# Patient Record
Sex: Male | Born: 1985
Health system: Southern US, Community
[De-identification: ages and names within clinical notes are randomized; demographics above are authoritative.]

## PROBLEM LIST (undated history)

## (undated) DIAGNOSIS — K219 Gastro-esophageal reflux disease without esophagitis: Secondary | ICD-10-CM

## (undated) DIAGNOSIS — H6120 Impacted cerumen, unspecified ear: Secondary | ICD-10-CM

## (undated) DIAGNOSIS — N289 Disorder of kidney and ureter, unspecified: Secondary | ICD-10-CM

## (undated) DIAGNOSIS — M940 Chondrocostal junction syndrome [Tietze]: Secondary | ICD-10-CM

## (undated) DIAGNOSIS — R2242 Localized swelling, mass and lump, left lower limb: Secondary | ICD-10-CM

## (undated) DIAGNOSIS — R200 Anesthesia of skin: Secondary | ICD-10-CM

## (undated) DIAGNOSIS — I10 Essential (primary) hypertension: Secondary | ICD-10-CM

## (undated) DIAGNOSIS — R079 Chest pain, unspecified: Secondary | ICD-10-CM

## (undated) DIAGNOSIS — R300 Dysuria: Secondary | ICD-10-CM

## (undated) DIAGNOSIS — G379 Demyelinating disease of central nervous system, unspecified: Secondary | ICD-10-CM

## (undated) DIAGNOSIS — R42 Dizziness and giddiness: Secondary | ICD-10-CM

## (undated) HISTORY — DX: Chest pain, unspecified: R07.9

## (undated) HISTORY — DX: Demyelinating disease of central nervous system, unspecified: G37.9

## (undated) HISTORY — DX: Anesthesia of skin: R20.0

## (undated) HISTORY — DX: Gastro-esophageal reflux disease without esophagitis: K21.9

## (undated) HISTORY — DX: Dysuria: R30.0

## (undated) HISTORY — DX: Impacted cerumen, unspecified ear: H61.20

## (undated) HISTORY — DX: Localized swelling, mass and lump, left lower limb: R22.42

## (undated) HISTORY — DX: Chondrocostal junction syndrome (tietze): M94.0

## (undated) HISTORY — DX: Dizziness and giddiness: R42

---

## 2012-07-18 DIAGNOSIS — E669 Obesity, unspecified: Secondary | ICD-10-CM | POA: Insufficient documentation

## 2015-04-04 ENCOUNTER — Emergency Department (HOSPITAL_COMMUNITY): Payer: BLUE CROSS/BLUE SHIELD

## 2015-04-04 ENCOUNTER — Emergency Department (HOSPITAL_COMMUNITY)
Admission: EM | Admit: 2015-04-04 | Discharge: 2015-04-04 | Disposition: A | Discharge status: Home / Self Care | Payer: BLUE CROSS/BLUE SHIELD | Attending: Emergency Medicine | Admitting: Emergency Medicine

## 2015-04-04 ENCOUNTER — Encounter (HOSPITAL_COMMUNITY): Payer: Self-pay | Admitting: *Deleted

## 2015-04-04 DIAGNOSIS — N201 Calculus of ureter: Secondary | ICD-10-CM | POA: Insufficient documentation

## 2015-04-04 DIAGNOSIS — R109 Unspecified abdominal pain: Secondary | ICD-10-CM | POA: Diagnosis present

## 2015-04-04 DIAGNOSIS — Z79899 Other long term (current) drug therapy: Secondary | ICD-10-CM | POA: Insufficient documentation

## 2015-04-04 HISTORY — DX: Disorder of kidney and ureter, unspecified: N28.9

## 2015-04-04 HISTORY — DX: Essential (primary) hypertension: I10

## 2015-04-04 LAB — BASIC METABOLIC PANEL
ANION GAP: 8 (ref 5–15)
BUN: 16 mg/dL (ref 6–20)
CALCIUM: 9.4 mg/dL (ref 8.9–10.3)
CO2: 26 mmol/L (ref 22–32)
Chloride: 106 mmol/L (ref 101–111)
Creatinine, Ser: 1.37 mg/dL — ABNORMAL HIGH (ref 0.61–1.24)
Glucose, Bld: 128 mg/dL — ABNORMAL HIGH (ref 65–99)
Potassium: 4 mmol/L (ref 3.5–5.1)
Sodium: 140 mmol/L (ref 135–145)

## 2015-04-04 LAB — CBC WITH DIFFERENTIAL/PLATELET
BASOS ABS: 0.1 10*3/uL (ref 0.0–0.1)
BASOS PCT: 0 %
Eosinophils Absolute: 0 10*3/uL (ref 0.0–0.7)
Eosinophils Relative: 0 %
HEMATOCRIT: 41.1 % (ref 39.0–52.0)
HEMOGLOBIN: 13.8 g/dL (ref 13.0–17.0)
Lymphocytes Relative: 7 %
Lymphs Abs: 1.2 10*3/uL (ref 0.7–4.0)
MCH: 28.2 pg (ref 26.0–34.0)
MCHC: 33.6 g/dL (ref 30.0–36.0)
MCV: 83.9 fL (ref 78.0–100.0)
Monocytes Absolute: 0.9 10*3/uL (ref 0.1–1.0)
Monocytes Relative: 5 %
NEUTROS ABS: 15.3 10*3/uL — AB (ref 1.7–7.7)
NEUTROS PCT: 88 %
Platelets: 333 10*3/uL (ref 150–400)
RBC: 4.9 MIL/uL (ref 4.22–5.81)
RDW: 13.2 % (ref 11.5–15.5)
WBC: 17.4 10*3/uL — AB (ref 4.0–10.5)

## 2015-04-04 LAB — URINE MICROSCOPIC-ADD ON

## 2015-04-04 LAB — URINALYSIS, ROUTINE W REFLEX MICROSCOPIC
Bilirubin Urine: NEGATIVE
Glucose, UA: NEGATIVE mg/dL
KETONES UR: NEGATIVE mg/dL
Leukocytes, UA: NEGATIVE
Nitrite: NEGATIVE
PH: 5.5 (ref 5.0–8.0)
Specific Gravity, Urine: >1.03 — ABNORMAL HIGH (ref 1.005–1.030)

## 2015-04-04 MED ORDER — ONDANSETRON 8 MG PO TBDP: 8.0000 mg | ORAL_TABLET | Freq: Three times a day (TID) | ORAL | Status: DC | PRN

## 2015-04-04 MED ORDER — KETOROLAC TROMETHAMINE 30 MG/ML IJ SOLN
30.0000 mg | Freq: Once | INTRAMUSCULAR | Status: AC
  Administered 2015-04-04: 30 mg via INTRAVENOUS
  Filled 2015-04-04: qty 1

## 2015-04-04 MED ORDER — OXYCODONE-ACETAMINOPHEN 5-325 MG PO TABS: 1.0000 | ORAL_TABLET | Freq: Four times a day (QID) | ORAL | Status: DC | PRN

## 2015-04-04 MED ORDER — ONDANSETRON HCL 4 MG/2ML IJ SOLN
4.0000 mg | Freq: Once | INTRAMUSCULAR | Status: AC
  Administered 2015-04-04: 4 mg via INTRAVENOUS
  Filled 2015-04-04: qty 2

## 2015-04-04 MED ORDER — HYDROMORPHONE HCL 1 MG/ML IJ SOLN
1.0000 mg | Freq: Once | INTRAMUSCULAR | Status: AC
  Administered 2015-04-04: 1 mg via INTRAVENOUS
  Filled 2015-04-04: qty 1

## 2015-04-04 NOTE — Discharge Instructions (Signed)
Kidney Stones °Kidney stones (urolithiasis) are deposits that form inside your kidneys. The intense pain is caused by the stone moving through the urinary tract. When the stone moves, the ureter goes into spasm around the stone. The stone is usually passed in the urine.  °CAUSES  °· A disorder that makes certain neck glands produce too much parathyroid hormone (primary hyperparathyroidism). °· A buildup of uric acid crystals, similar to gout in your joints. °· Narrowing (stricture) of the ureter. °· A kidney obstruction present at birth (congenital obstruction). °· Previous surgery on the kidney or ureters. °· Numerous kidney infections. °SYMPTOMS  °· Feeling sick to your stomach (nauseous). °· Throwing up (vomiting). °· Blood in the urine (hematuria). °· Pain that usually spreads (radiates) to the groin. °· Frequency or urgency of urination. °DIAGNOSIS  °· Taking a history and physical exam. °· Blood or urine tests. °· CT scan. °· Occasionally, an examination of the inside of the urinary bladder (cystoscopy) is performed. °TREATMENT  °· Observation. °· Increasing your fluid intake. °· Extracorporeal shock wave lithotripsy--This is a noninvasive procedure that uses shock waves to break up kidney stones. °· Surgery may be needed if you have severe pain or persistent obstruction. There are various surgical procedures. Most of the procedures are performed with the use of small instruments. Only small incisions are needed to accommodate these instruments, so recovery time is minimized. °The size, location, and chemical composition are all important variables that will determine the proper choice of action for you. Talk to your health care provider to better understand your situation so that you will minimize the risk of injury to yourself and your kidney.  °HOME CARE INSTRUCTIONS  °· Drink enough water and fluids to keep your urine clear or pale yellow. This will help you to pass the stone or stone fragments. °· Strain  all urine through the provided strainer. Keep all particulate matter and stones for your health care provider to see. The stone causing the pain may be as small as a grain of salt. It is very important to use the strainer each and every time you pass your urine. The collection of your stone will allow your health care provider to analyze it and verify that a stone has actually passed. The stone analysis will often identify what you can do to reduce the incidence of recurrences. °· Only take over-the-counter or prescription medicines for pain, discomfort, or fever as directed by your health care provider. °· Keep all follow-up visits as told by your health care provider. This is important. °· Get follow-up X-rays if required. The absence of pain does not always mean that the stone has passed. It may have only stopped moving. If the urine remains completely obstructed, it can cause loss of kidney function or even complete destruction of the kidney. It is your responsibility to make sure X-rays and follow-ups are completed. Ultrasounds of the kidney can show blockages and the status of the kidney. Ultrasounds are not associated with any radiation and can be performed easily in a matter of minutes. °· Make changes to your daily diet as told by your health care provider. You may be told to: °¨ Limit the amount of salt that you eat. °¨ Eat 5 or more servings of fruits and vegetables each day. °¨ Limit the amount of meat, poultry, fish, and eggs that you eat. °· Collect a 24-hour urine sample as told by your health care provider. You may need to collect another urine sample every 6-12   months. °SEEK MEDICAL CARE IF: °· You experience pain that is progressive and unresponsive to any pain medicine you have been prescribed. °SEEK IMMEDIATE MEDICAL CARE IF:  °· Pain cannot be controlled with the prescribed medicine. °· You have a fever or shaking chills. °· The severity or intensity of pain increases over 18 hours and is not  relieved by pain medicine. °· You develop a new onset of abdominal pain. °· You feel faint or pass out. °· You are unable to urinate. °  °This information is not intended to replace advice given to you by your health care provider. Make sure you discuss any questions you have with your health care provider. °  °Document Released: 03/15/2005 Document Revised: 12/04/2014 Document Reviewed: 08/16/2012 °Elsevier Interactive Patient Education ©2016 Elsevier Inc. ° °

## 2015-04-04 NOTE — ED Provider Notes (Signed)
CSN: [id number]     Arrival date & time 04/04/15  2021 History   First MD Initiated Contact with Patient 04/04/15 2024     Chief Complaint  Patient presents with  . Flank Pain     Patient is a 30 y.o. male presenting with flank pain. The history is provided by the patient.  Flank Pain This is a recurrent problem. Pertinent negatives include no chest pain, no abdominal pain, no headaches and no shortness of breath.   patient presents with left flank pain. Has had previous kidney stones. States this feels the same. Has had nausea. Said pain for last couple days. He cannot find a comfortable position. No fevers or chills. No cough. States he has some urinary hesitancy.  Past Medical History  Diagnosis Date  . Renal disorder     kidney stones  . Hypertension     prehypertension   History reviewed. No pertinent past surgical history. History reviewed. No pertinent family history. Social History  Substance Use Topics  . Smoking status: Never Smoker   . Smokeless tobacco: None  . Alcohol Use: No    Review of Systems  Constitutional: Negative for activity change and appetite change.  Eyes: Negative for pain.  Respiratory: Negative for chest tightness and shortness of breath.   Cardiovascular: Negative for chest pain and leg swelling.  Gastrointestinal: Negative for nausea, vomiting, abdominal pain and diarrhea.  Genitourinary: Positive for flank pain.  Musculoskeletal: Negative for back pain and neck stiffness.  Skin: Negative for rash.  Neurological: Negative for weakness, numbness and headaches.  Psychiatric/Behavioral: Negative for behavioral problems.      Allergies  Review of patient's allergies indicates no known allergies.  Home Medications   Prior to Admission medications   Medication Sig Start Date End Date Taking? Authorizing Provider  acetaminophen (TYLENOL) 500 MG tablet Take 1,000 mg by mouth every 6 (six) hours as needed for mild pain, moderate pain or  headache.   Yes Historical Provider, MD  lisinopril (PRINIVIL,ZESTRIL) 5 MG tablet Take 5 mg by mouth every evening.    Yes Historical Provider, MD   BP 144/95 mmHg  Pulse 72  Temp(Src) 98.1 F (36.7 C) (Oral)  Resp 20  Ht 6\' 6"  (1.981 m)  Wt 340 lb (154.223 kg)  BMI 39.30 kg/m2  SpO2 97% Physical Exam  Constitutional: He appears well-developed.  Patient appears uncomfortable  HENT:  Head: Atraumatic.  Neck: Neck supple.  Cardiovascular: Normal rate.   Pulmonary/Chest: Effort normal.  Abdominal: Soft.  Mild left lower quadrant tenderness without rebound or guarding. Some CVA tenderness on left also.  Musculoskeletal: Normal range of motion.  Neurological: He is alert.    ED Course  Procedures (including critical care time) Labs Review Labs Reviewed  URINALYSIS, ROUTINE W REFLEX MICROSCOPIC (NOT AT [facility]) - Abnormal; Notable for the following:    Specific Gravity, Urine >1.030 (*)    Hgb urine dipstick SMALL (*)    Protein, ur TRACE (*)    All other components within normal limits  BASIC METABOLIC PANEL - Abnormal; Notable for the following:    Glucose, Bld 128 (*)    Creatinine, Ser 1.37 (*)    All other components within normal limits  CBC WITH DIFFERENTIAL/PLATELET - Abnormal; Notable for the following:    WBC 17.4 (*)    Neutro Abs 15.3 (*)    All other components within normal limits  URINE MICROSCOPIC-ADD ON - Abnormal; Notable for the following:    Squamous Epithelial /  LPF 0-5 (*)    Bacteria, UA RARE (*)    All other components within normal limits  URINE CULTURE    Imaging Review Dg Abd 1 View  04/04/2015  CLINICAL DATA:  Left sided flank pain radiating to the low abdomen with nausea and vomiting. Symptoms for 5 hours. EXAM: ABDOMEN - 1 VIEW COMPARISON:  None. FINDINGS: Small stone, 1-2 mm, projects in the left lower pelvis which could reflect distal left ureteral stone or a phlebolith. No other evidence of a ureteral stone. No evidence of a renal stone.  Soft tissues are otherwise unremarkable. Normal bowel gas pattern. Skeletal structures are unremarkable. IMPRESSION: Possible small stone in the distal left ureter. Otherwise unremarkable. Electronically Signed   By: Amie Portland M.D.   On: 04/04/2015 21:20   I have personally reviewed and evaluated these images and lab results as part of my medical decision-making.   EKG Interpretation None      MDM   Final diagnoses:  None    Patient with flank pain. Likely ureteral stone. Has had same. Likely stone on KUB. Feels much better after treatment. Creatinine is mildly elevated. Will need to be followed. White count is also elevated but doubt infection. Patient feels better will be discharged home.    Benjiman Core, MD 04/04/15 2233

## 2015-04-04 NOTE — ED Notes (Signed)
Pt c/o left sided flank pain that radiates around to his lower abdomen. Pt also has n/v.

## 2015-04-07 LAB — URINE CULTURE: CULTURE: NO GROWTH

## 2016-09-28 ENCOUNTER — Ambulatory Visit: Payer: Self-pay | Admitting: Emergency Medicine

## 2016-10-25 ENCOUNTER — Encounter: Payer: Self-pay | Admitting: Family Medicine

## 2016-10-25 ENCOUNTER — Ambulatory Visit (INDEPENDENT_AMBULATORY_CARE_PROVIDER_SITE_OTHER): Payer: BLUE CROSS/BLUE SHIELD | Admitting: Family Medicine

## 2016-10-25 VITALS — BP 122/74 | HR 59 | Temp 98.8°F | Ht 78.0 in | Wt 358.2 lb

## 2016-10-25 DIAGNOSIS — R2242 Localized swelling, mass and lump, left lower limb: Secondary | ICD-10-CM | POA: Diagnosis not present

## 2016-10-25 DIAGNOSIS — R200 Anesthesia of skin: Secondary | ICD-10-CM

## 2016-10-25 DIAGNOSIS — G379 Demyelinating disease of central nervous system, unspecified: Secondary | ICD-10-CM | POA: Diagnosis not present

## 2016-10-25 DIAGNOSIS — I10 Essential (primary) hypertension: Secondary | ICD-10-CM | POA: Diagnosis not present

## 2016-10-25 DIAGNOSIS — H6123 Impacted cerumen, bilateral: Secondary | ICD-10-CM | POA: Diagnosis not present

## 2016-10-25 DIAGNOSIS — M940 Chondrocostal junction syndrome [Tietze]: Secondary | ICD-10-CM

## 2016-10-25 LAB — POCT URINALYSIS DIP (MANUAL ENTRY)
Bilirubin, UA: NEGATIVE
Blood, UA: NEGATIVE
GLUCOSE UA: NEGATIVE mg/dL
Ketones, POC UA: NEGATIVE mg/dL
Leukocytes, UA: NEGATIVE
NITRITE UA: NEGATIVE
Protein Ur, POC: NEGATIVE mg/dL
SPEC GRAV UA: 1.02 (ref 1.010–1.025)
UROBILINOGEN UA: 1 U/dL
pH, UA: 7 (ref 5.0–8.0)

## 2016-10-25 NOTE — Patient Instructions (Signed)
It was very good to meet you today!  Come back in the next 2-4 weeks and we can irrigate the wax blockage of your ears. Use the Debrox or other over-the-counter softener once a day until then in both ears.  We are checking some labs today and I will let you know the results.  I do believe her chest pain is from her back. The term for this is costochondritis. Stretching, exercise, manipulations. Back are submitted that can help with this. It is very painful you can use ibuprofen.   Costochondritis Costochondritis is swelling and irritation (inflammation) of the tissue (cartilage) that connects your ribs to your breastbone (sternum). This causes pain in the front of your chest. Usually, the pain:  Starts gradually.  Is in more than one rib.  This condition usually goes away on its own over time. Follow these instructions at home:  Do not do anything that makes your pain worse.  If directed, put ice on the painful area: ? Put ice in a plastic bag. ? Place a towel between your skin and the bag. ? Leave the ice on for 20 minutes, 2-3 times a day.  If directed, put heat on the affected area as often as told by your doctor. Use the heat source that your doctor tells you to use, such as a moist heat pack or a heating pad. ? Place a towel between your skin and the heat source. ? Leave the heat on for 20-30 minutes. ? Take off the heat if your skin turns bright red. This is very important if you cannot feel pain, heat, or cold. You may have a greater risk of getting burned.  Take over-the-counter and prescription medicines only as told by your doctor.  Return to your normal activities as told by your doctor. Ask your doctor what activities are safe for you.  Keep all follow-up visits as told by your doctor. This is important. Contact a doctor if:  You have chills or a fever.  Your pain does not go away or it gets worse.  You have a cough that does not go away. Get help right away  if:  You are short of breath. This information is not intended to replace advice given to you by your health care provider. Make sure you discuss any questions you have with your health care provider. Document Released: 09/01/2007 Document Revised: 10/03/2015 Document Reviewed: 07/09/2015 Elsevier Interactive Patient Education  [employer]2018 Elsevier Inc.

## 2016-10-25 NOTE — Progress Notes (Signed)
Subjective:    Todd Davis is a 31 y.o. male who [facility] for a new patient evaluation:  New patient:  [name] was [facility], but wishes to switch PCP's due to a recent move.  He has the following concerns:  1. Chest pain:  Ongoing issue for past several months.  Has been evaluated by cardiologist and prior PCP.  Negative Echo.  Negative EKGs.  Told it was musculoskeletal but doesn't really know what that means.  Painful when wearing heavy backpack, straightening back, lifting arms above head.  Better with ibuprofen, which he takes sporadically.  Not brought on by walking/exertion, unless wearing backpack.  Not associated with dyspnea, diaphoresis, nausea.  Nonsmoker.  Non-diabetic.  No family history of cardiac disease.    2.  Leg "lump:"  Present for about 3 years.  Hasn't grown.  Nonpainful.  Has been evaluated by prior providers and opted for observation.  Hasn't tried anything to make it go away.    3.  BL hand numbness:  Present most AMs.  Occurs mostly palmar distribution (he thinks).  Shakes out his hands, and the sensation returns rapidly.  Never any pain.  Sometimes can reproduce sensation if driving and gripping the wheel tightly.  Plays computer video games frequently.    He also reports frequent, strong urges to urinate that occur daily.  He drinks "a lot" of caffeine per day.  No incontinence or stress incont issues.     ROS as above per HPI.  Otherwise, the patient denies fever, unusual weight change,  chest pain, palpitations, pre-syncopal or syncopal episodes, dyspnea on exertion, prolonged cough, hemoptysis, change in bowel habits, melena, hematochezia, severe indigestion/heartburn, nausea/vomiting/abdominal pain, genital sores, muscle weakness, difficulty walking, abnormal bleeding, or enlarged lymph nodes.    The following portions of the patient's history were reviewed and updated as appropriate: allergies, current medications, past  medical history, family and social history, and problem list. Patient is a nonsmoker.    PMH reviewed. - Does have HTN for which he takes lisinopril. - Also with history of GERD -- on omeprazole. - Had MRI s/p Left sided numbness -- diagnosed with myelin sheath damage.     Past Medical History:  Diagnosis Date  . Hypertension    prehypertension  . Renal disorder    kidney stones   No past surgical history on file.  Medications reviewed. Current Outpatient Prescriptions  Medication Sig Dispense Refill  . lisinopril (PRINIVIL,ZESTRIL) 5 MG tablet Take 5 mg by mouth every evening.      No current facility-administered medications for this visit.    Social:  Nonsmoker.  ~1 EtOH drink per day.  No illicit drug use  Family History:  Mom with depression and hypothyroidism.  Father with HTN.   Objective:   Physical Exam BP 122/74   Pulse (!) 59   Temp 98.8 F (37.1 C) (Oral)   Ht 6\' 6"  (1.981 m)   Wt (!) 358 lb 3.2 oz (162.5 kg)   SpO2 98%   BMI 41.39 kg/m  Gen:  Alert, cooperative patient who appears stated age in no acute distress.  Vital signs reviewed. Head: [place]/AT.   Eyes:  EOMI, PERRL.   Ears:  External ears WNL, cerumen impaction noted Right ear canal.  Some dry crusted cerumen Left ear.  Able to see about 25% of TM, which is normal appearing.   Nose:  Septum midline  Mouth:  MMM, tonsils non-erythematous, non-edematous.   Cardiac:  Regular rate and rhythm without murmur auscultated.  Good S1/S2. Pulm:  Clear to auscultation bilaterally with good air movement.  No wheezes or rales noted.   Abd:  Soft/nondistended/nontender.  Obese Exts: No edema BL ankles.  Has a 3.5 x 3 cm mobile, nontender, non-erythematous subcutaneous nodule left lateral calf.  No bruising or scarring evident.    No results found for this or any previous visit (from the past 72 hour(s)).

## 2016-10-26 ENCOUNTER — Encounter: Payer: Self-pay | Admitting: Family Medicine

## 2016-10-26 DIAGNOSIS — R2242 Localized swelling, mass and lump, left lower limb: Secondary | ICD-10-CM | POA: Insufficient documentation

## 2016-10-26 DIAGNOSIS — G379 Demyelinating disease of central nervous system, unspecified: Secondary | ICD-10-CM

## 2016-10-26 DIAGNOSIS — I10 Essential (primary) hypertension: Secondary | ICD-10-CM | POA: Insufficient documentation

## 2016-10-26 DIAGNOSIS — M940 Chondrocostal junction syndrome [Tietze]: Secondary | ICD-10-CM

## 2016-10-26 DIAGNOSIS — H6120 Impacted cerumen, unspecified ear: Secondary | ICD-10-CM

## 2016-10-26 DIAGNOSIS — R200 Anesthesia of skin: Secondary | ICD-10-CM

## 2016-10-26 HISTORY — DX: Localized swelling, mass and lump, left lower limb: R22.42

## 2016-10-26 HISTORY — DX: Demyelinating disease of central nervous system, unspecified: G37.9

## 2016-10-26 HISTORY — DX: Impacted cerumen, unspecified ear: H61.20

## 2016-10-26 HISTORY — DX: Chondrocostal junction syndrome (tietze): M94.0

## 2016-10-26 HISTORY — DX: Anesthesia of skin: R20.0

## 2016-10-26 LAB — COMPREHENSIVE METABOLIC PANEL
A/G RATIO: 1.3 (ref 1.2–2.2)
ALBUMIN: 4.3 g/dL (ref 3.5–5.5)
ALT: 22 IU/L (ref 0–44)
AST: 22 IU/L (ref 0–40)
Alkaline Phosphatase: 86 IU/L (ref 39–117)
BILIRUBIN TOTAL: 0.4 mg/dL (ref 0.0–1.2)
BUN / CREAT RATIO: 13 (ref 9–20)
BUN: 12 mg/dL (ref 6–20)
CHLORIDE: 102 mmol/L (ref 96–106)
CO2: 25 mmol/L (ref 20–29)
Calcium: 9.3 mg/dL (ref 8.7–10.2)
Creatinine, Ser: 0.95 mg/dL (ref 0.76–1.27)
GFR calc Af Amer: 124 mL/min/{1.73_m2} (ref >59)
GFR, EST NON AFRICAN AMERICAN: 107 mL/min/{1.73_m2} (ref >59)
GLOBULIN, TOTAL: 3.2 g/dL (ref 1.5–4.5)
GLUCOSE: 87 mg/dL (ref 65–99)
POTASSIUM: 4.1 mmol/L (ref 3.5–5.2)
SODIUM: 143 mmol/L (ref 134–144)
TOTAL PROTEIN: 7.5 g/dL (ref 6.0–8.5)

## 2016-10-26 LAB — LIPID PANEL
CHOL/HDL RATIO: 4.1 ratio (ref 0.0–5.0)
Cholesterol, Total: 142 mg/dL (ref 100–199)
HDL: 35 mg/dL — AB (ref >39)
LDL Calculated: 71 mg/dL (ref 0–99)
Triglycerides: 182 mg/dL — ABNORMAL HIGH (ref 0–149)
VLDL Cholesterol Cal: 36 mg/dL (ref 5–40)

## 2016-10-26 LAB — CBC
HEMATOCRIT: 41.4 % (ref 37.5–51.0)
HEMOGLOBIN: 13.6 g/dL (ref 13.0–17.7)
MCH: 27.6 pg (ref 26.6–33.0)
MCHC: 32.9 g/dL (ref 31.5–35.7)
MCV: 84 fL (ref 79–97)
Platelets: 324 10*3/uL (ref 150–379)
RBC: 4.92 x10E6/uL (ref 4.14–5.80)
RDW: 14.1 % (ref 12.3–15.4)
WBC: 9.8 10*3/uL (ref 3.4–10.8)

## 2016-10-26 LAB — TSH: TSH: 1.97 u[IU]/mL (ref 0.450–4.500)

## 2016-10-26 NOTE — Assessment & Plan Note (Addendum)
Following with neurology. Currently asymptomatic.    Personally reviewed neurologist's notes and outside MRI.

## 2016-10-26 NOTE — Assessment & Plan Note (Signed)
Most likely cause of chest pain worsened by movement and better with ibuprofen.  Evaluated by cardiologist and deemed likely MSK Postural support, increase activity, heat/ice to back when bothers him. Continue NSAIDs for relief. Warning/return precautions provided.

## 2016-10-26 NOTE — Assessment & Plan Note (Signed)
Present for +3 years.  Non-concerning findings on exam.  Discussed biopsy vs US vs observation.  Patient chooses observation.  If any changes, will send for US +/- biopsy.

## 2016-10-26 NOTE — Assessment & Plan Note (Addendum)
Mild, likely carpal tunnel based on symptoms Opted for ergonomic keyboard rather than splinting FU after acquires keyboard to assess for improvement.  Will watch to ensure not a symptom of any demyelinating process -- see below.

## 2016-10-26 NOTE — Assessment & Plan Note (Signed)
Continue ACE-I Good today Check renal function labs

## 2016-10-26 NOTE — Assessment & Plan Note (Signed)
Debrox to soften Return in 2-4 weeks for ear irrigation.

## 2016-11-04 ENCOUNTER — Encounter: Payer: Self-pay | Admitting: Family Medicine

## 2016-11-05 MED ORDER — LISINOPRIL 5 MG PO TABS: 5.0000 mg | ORAL_TABLET | Freq: Every evening | ORAL | 2 refills | Status: DC

## 2016-11-11 ENCOUNTER — Encounter: Payer: Self-pay | Admitting: Family Medicine

## 2016-11-16 MED ORDER — LISINOPRIL 20 MG PO TABS: 20.0000 mg | ORAL_TABLET | Freq: Every day | ORAL | 1 refills | Status: DC

## 2016-11-17 MED ORDER — LISINOPRIL 20 MG PO TABS: 20.0000 mg | ORAL_TABLET | Freq: Every day | ORAL | 1 refills | Status: DC

## 2016-11-17 NOTE — Addendum Note (Signed)
Addended by: Tobey Grim on: 11/17/2016 09:41 AM   Modules accepted: Orders

## 2016-11-17 NOTE — Telephone Encounter (Signed)
Hello [employer], can you cancel the 20 mg prescription to [facility]?  I have sent this in instead to [place] as the patient requested.  Thanks!  JW

## 2017-03-11 ENCOUNTER — Ambulatory Visit: Payer: BLUE CROSS/BLUE SHIELD | Admitting: Family Medicine

## 2017-05-24 ENCOUNTER — Encounter: Payer: BLUE CROSS/BLUE SHIELD | Admitting: Family Medicine

## 2017-05-24 ENCOUNTER — Encounter: Payer: Self-pay | Admitting: Family Medicine

## 2017-05-24 ENCOUNTER — Ambulatory Visit (INDEPENDENT_AMBULATORY_CARE_PROVIDER_SITE_OTHER): Payer: BLUE CROSS/BLUE SHIELD | Admitting: Family Medicine

## 2017-05-24 ENCOUNTER — Other Ambulatory Visit: Payer: Self-pay

## 2017-05-24 VITALS — BP 124/78 | HR 80 | Temp 98.2°F | Ht 78.0 in | Wt 353.0 lb

## 2017-05-24 DIAGNOSIS — I10 Essential (primary) hypertension: Secondary | ICD-10-CM

## 2017-05-24 DIAGNOSIS — R42 Dizziness and giddiness: Secondary | ICD-10-CM

## 2017-05-24 DIAGNOSIS — H6123 Impacted cerumen, bilateral: Secondary | ICD-10-CM

## 2017-05-24 LAB — GLUCOSE, POCT (MANUAL RESULT ENTRY): POC Glucose: 96 mg/dl (ref 70–99)

## 2017-05-24 NOTE — Patient Instructions (Signed)
It was very good to see you today.  We are checking blood work today and I'll let you know the results.  I think your symptoms were from the change in your diet.  If they come back let me know.    Come back and see me in 6 months.  You can use the Debrox for your ear in the meantime.  If you'd like, come back and see us after about 4 weeks of using the debrox and we can clean out your right ear too.

## 2017-05-24 NOTE — Progress Notes (Signed)
Subjective:    Todd Davis is a 32 y.o. male who [facility] for dizzy spells:  1.  Dizziness:  Occurred a few weeks ago when he changed his diet.  Began incorporating a high number of fruits and vegetables in his meals and switched from [employer] biscuits for breakfast to smoothies.  Described "odd feeling" that he could only describe as dizziness without vertiginous or lightheaded symptoms late in the afternoon.  Not associated with food intake.  No nausea or vomiting.  No decreased food intake.  No anorexia.  No diarrhea or constipation.  No falls.  Resolved about a week after changing his diet without any further symptoms.    2.   Hypertension:  Long-term problem for this patient.  No adverse effects from medication.  Not checking it regularly.  No HA, CP, dizziness, shortness of breath, palpitations, or LE swelling.   BP Readings from Last 3 Encounters:  05/24/17 124/78  10/25/16 122/74  04/04/15 147/89    ROS as above per HPI.  Pertinently, no chest pain, palpitations, SOB, Fever, Chills, Abd pain, N/V/D.   The following portions of the patient's history were reviewed and updated as appropriate: allergies, current medications, past medical history, family and social history, and problem list. Patient is a nonsmoker.    PMH reviewed.  Past Medical History:  Diagnosis Date  . GERD (gastroesophageal reflux disease)   . Hypertension    prehypertension  . Renal disorder    kidney stones   No past surgical history on file.  Medications reviewed. Current Outpatient Medications  Medication Sig Dispense Refill  . lisinopril (PRINIVIL,ZESTRIL) 20 MG tablet Take 1 tablet (20 mg total) by mouth daily. 90 tablet 1   No current facility-administered medications for this visit.      Objective:   Physical Exam BP 124/78   Pulse 80   Temp 98.2 F (36.8 C) (Oral)   Ht 6\' 6"  (1.981 m)   Wt (!) 353 lb (160.1 kg)   SpO2 96%   BMI 40.79 kg/m  Gen:  Alert, cooperative  patient who appears stated age in no acute distress.  Vital signs reviewed. HEENT: EOMI,  MMM.  BL ear cerumen impaction, Right > Left Cardiac:  Regular rate and rhythm  Pulm:  Clear to auscultation bilaterally with good air movement.  No wheezes or rales noted.   Abd:  Soft/nondistended/nontender.   Exts: Non edematous BL  LE, warm and well perfused.  Neuro:  Strength and sensation completely normal and +5 all extremities.    Results for orders placed or performed in visit on 05/24/17  Glucose (CBG)  Result Value Ref Range   POC Glucose 96 70 - 99 mg/dl   Imp/Plan: 1. Dizziness: - checking lab work today.  CBG as completely normal.  Believe this was due to change in diet and relative decrease in carbohydrates and general unhealthy food.  Commended him on this - no vertigo or orthostatic symptoms - Pulse did increase after standing, while BP also increased.  More likely from deconditioning.  He plans to start working out  2.  HTN:  - at goal - no evidence this is dropping too low.   - checking creatinine  3.  Cerumen impaction: - left ear irrigated here today  - debrox in right ear.  He uses earphones in RIght ear only  - FU in 2-4 weeks for irrigation.  6 months for other medical issues.

## 2017-05-25 LAB — COMPREHENSIVE METABOLIC PANEL
A/G RATIO: 1.4 (ref 1.2–2.2)
ALT: 34 IU/L (ref 0–44)
AST: 24 IU/L (ref 0–40)
Albumin: 4.6 g/dL (ref 3.5–5.5)
Alkaline Phosphatase: 92 IU/L (ref 39–117)
BUN/Creatinine Ratio: 12 (ref 9–20)
BUN: 12 mg/dL (ref 6–20)
Bilirubin Total: 0.3 mg/dL (ref 0.0–1.2)
CALCIUM: 9.4 mg/dL (ref 8.7–10.2)
CO2: 26 mmol/L (ref 20–29)
CREATININE: 1.02 mg/dL (ref 0.76–1.27)
Chloride: 101 mmol/L (ref 96–106)
GFR, EST AFRICAN AMERICAN: 113 mL/min/{1.73_m2} (ref >59)
GFR, EST NON AFRICAN AMERICAN: 97 mL/min/{1.73_m2} (ref >59)
Globulin, Total: 3.3 g/dL (ref 1.5–4.5)
Glucose: 105 mg/dL — ABNORMAL HIGH (ref 65–99)
POTASSIUM: 4.4 mmol/L (ref 3.5–5.2)
Sodium: 141 mmol/L (ref 134–144)
Total Protein: 7.9 g/dL (ref 6.0–8.5)

## 2017-05-25 LAB — CBC
HEMATOCRIT: 41.5 % (ref 37.5–51.0)
HEMOGLOBIN: 13.9 g/dL (ref 13.0–17.7)
MCH: 28 pg (ref 26.6–33.0)
MCHC: 33.5 g/dL (ref 31.5–35.7)
MCV: 84 fL (ref 79–97)
Platelets: 343 10*3/uL (ref 150–379)
RBC: 4.97 x10E6/uL (ref 4.14–5.80)
RDW: 13.9 % (ref 12.3–15.4)
WBC: 10 10*3/uL (ref 3.4–10.8)

## 2017-05-25 LAB — LIPID PANEL
CHOL/HDL RATIO: 3.6 ratio (ref 0.0–5.0)
Cholesterol, Total: 141 mg/dL (ref 100–199)
HDL: 39 mg/dL — AB (ref >39)
LDL CALC: 76 mg/dL (ref 0–99)
Triglycerides: 131 mg/dL (ref 0–149)
VLDL CHOLESTEROL CAL: 26 mg/dL (ref 5–40)

## 2017-05-25 LAB — TSH: TSH: 1.38 u[IU]/mL (ref 0.450–4.500)

## 2017-05-26 ENCOUNTER — Encounter: Payer: Self-pay | Admitting: Family Medicine

## 2017-07-22 ENCOUNTER — Other Ambulatory Visit: Payer: Self-pay

## 2017-07-22 ENCOUNTER — Ambulatory Visit: Payer: BLUE CROSS/BLUE SHIELD | Admitting: Student in an Organized Health Care Education/Training Program

## 2017-07-22 ENCOUNTER — Encounter: Payer: Self-pay | Admitting: Student in an Organized Health Care Education/Training Program

## 2017-07-22 DIAGNOSIS — R42 Dizziness and giddiness: Secondary | ICD-10-CM

## 2017-07-22 NOTE — Progress Notes (Signed)
   Subjective:    Todd Davis - 32 y.o. male MRN [id number]  Date of birth: 12/18/1985  HPI  Todd CollardZachary Polyak is here for dizziness.  Patient was previously seen for dizzy spells in 05/24/2017 by PCP. Saturday night developed light headedness, which continued throughout the week.  He also reports that different parts of his head hurt throughout the week. Tylenol and ibuprofen did not help. Reports taking his blood pressure medications regularly and BPs at home have been 140/80. - Dizziness has been constant since onset but waxes and wanes - Dizziness is worse later in the day - Dizziness is not worse with standing - Feels dizziness is worse with eating - no syncope or falls  - Headaches have been intermittent. Has pain in neck, and head that started on right and goes to left. - Only has taken ibuprofen 2 days in a row - Feels it is a tension headache, lasts a couple hours, improves with tylenol - Eventually headaches do resolve  Patient reports he is concerned about nonspecific white matter changes that were seen on a previous brain MRI which [facility].    Health Maintenance Due  Topic Date Due  . HIV Screening  10/26/2000  . TETANUS/TDAP  10/26/2004    -  reports that he has never smoked. He has never used smokeless tobacco. - Review of Systems: Per HPI. - Past Medical History: Patient Active Problem List   Diagnosis Date Noted  . Light headedness 07/26/2017  . HTN (hypertension) 10/26/2016  . Bilateral hand numbness 10/26/2016  . Leg mass, left 10/26/2016  . Cerumen impaction 10/26/2016  . Demyelinating changes in brain (HCC) 10/26/2016  . Costochondritis 10/26/2016   - Medications: reviewed and updated Current Outpatient Medications  Medication Sig Dispense Refill  . lisinopril (PRINIVIL,ZESTRIL) 20 MG tablet Take 1 tablet (20 mg total) by mouth daily. 90 tablet 1   No current facility-administered medications for this visit.     Review of  Systems See HPI     Objective:   Physical Exam BP 134/78   Pulse 78   Temp 98.2 F (36.8 C) (Oral)   Ht 6\' 6"  (1.981 m)   Wt (!) 361 lb 9.6 oz (164 kg)   SpO2 98%   BMI 41.79 kg/m  Gen: NAD, alert, cooperative with exam, well-appearing  HEENT: NCAT, PERRL, clear conjunctiva, oropharynx clear, supple neck CV: RRR, good S1/S2, no murmur, no edema, capillary refill brisk  Resp: CTABL, no wheezes, non-labored Abd: SNTND, BS present, no guarding or organomegaly Skin: no rashes, normal turgor  Neuro: CN II-XII intact  Psych: good insight, alert and oriented     Assessment & Plan:   Light headedness Patient previously had labwork done by PCP which was unremarkable. No red flags with dizziness on this office visit. Patient is concerned about potential MS given history of nonspecific white matter changes. There are no focal neurologic deficits today, no red flags, and the story does not sound like MS, however I can understand his concern. Patient to follow up with previously established neurologist to see if repeat MRI necessary.  Howard PouchLauren Stasia Somero, MD,MS,  PGY2 07/26/2017 3:41 PM

## 2017-07-22 NOTE — Patient Instructions (Signed)
It was a pleasure seeing you today in our clinic. Today we discussed dizziness. Here is the treatment plan we have discussed and agreed upon together:  Your symptoms do not seem consistent with demylinating disease. I do not think we need to order urgent imaging.   Please contact your neurologist at [facility] to schedule another visit and see if you need a repeat MRI.  Our clinic's number is [phone]. Please call with questions or concerns about what we discussed today.  Be well, Dr. Mosetta PuttFeng  Sign up for My Chart to have easy access to your labs results, and communication with your primary care physician.

## 2017-07-26 DIAGNOSIS — R42 Dizziness and giddiness: Secondary | ICD-10-CM

## 2017-07-26 HISTORY — DX: Dizziness and giddiness: R42

## 2017-07-26 NOTE — Assessment & Plan Note (Signed)
Patient previously had labwork done by PCP which was unremarkable. No red flags with dizziness on this office visit. Patient is concerned about potential MS given history of nonspecific white matter changes. There are no focal neurologic deficits today, no red flags, and the story does not sound like MS, however I can understand his concern. Patient to follow up with previously established neurologist to see if repeat MRI necessary.

## 2017-07-27 ENCOUNTER — Encounter: Payer: Self-pay | Admitting: Student in an Organized Health Care Education/Training Program

## 2017-07-28 ENCOUNTER — Other Ambulatory Visit: Payer: Self-pay | Admitting: Student in an Organized Health Care Education/Training Program

## 2017-07-28 DIAGNOSIS — R42 Dizziness and giddiness: Secondary | ICD-10-CM

## 2017-08-20 ENCOUNTER — Other Ambulatory Visit: Payer: Self-pay | Admitting: Family Medicine

## 2017-09-28 ENCOUNTER — Encounter

## 2017-11-18 ENCOUNTER — Encounter: Payer: Self-pay | Admitting: Family Medicine

## 2017-11-21 ENCOUNTER — Ambulatory Visit: Payer: BLUE CROSS/BLUE SHIELD | Admitting: Family Medicine

## 2017-11-30 ENCOUNTER — Other Ambulatory Visit (HOSPITAL_COMMUNITY)
Admission: RE | Admit: 2017-11-30 | Discharge: 2017-11-30 | Disposition: A | Discharge status: Home / Self Care | Payer: PRIVATE HEALTH INSURANCE | Source: Ambulatory Visit | Attending: Family Medicine | Admitting: Family Medicine

## 2017-11-30 ENCOUNTER — Ambulatory Visit (INDEPENDENT_AMBULATORY_CARE_PROVIDER_SITE_OTHER): Payer: PRIVATE HEALTH INSURANCE | Admitting: Family Medicine

## 2017-11-30 ENCOUNTER — Encounter: Payer: Self-pay | Admitting: Family Medicine

## 2017-11-30 ENCOUNTER — Other Ambulatory Visit: Payer: Self-pay

## 2017-11-30 VITALS — BP 128/88 | HR 53 | Temp 98.3°F | Ht 78.0 in | Wt 353.4 lb

## 2017-11-30 DIAGNOSIS — R079 Chest pain, unspecified: Secondary | ICD-10-CM | POA: Diagnosis not present

## 2017-11-30 DIAGNOSIS — Z23 Encounter for immunization: Secondary | ICD-10-CM

## 2017-11-30 DIAGNOSIS — Z202 Contact with and (suspected) exposure to infections with a predominantly sexual mode of transmission: Secondary | ICD-10-CM | POA: Diagnosis not present

## 2017-11-30 DIAGNOSIS — R300 Dysuria: Secondary | ICD-10-CM | POA: Insufficient documentation

## 2017-11-30 LAB — POCT URINALYSIS DIP (MANUAL ENTRY)
BILIRUBIN UA: NEGATIVE mg/dL
Bilirubin, UA: NEGATIVE
Glucose, UA: NEGATIVE mg/dL
LEUKOCYTES UA: NEGATIVE
NITRITE UA: NEGATIVE
PH UA: 6.5 (ref 5.0–8.0)
PROTEIN UA: NEGATIVE mg/dL
RBC UA: NEGATIVE
Spec Grav, UA: 1.025 (ref 1.010–1.025)
Urobilinogen, UA: 0.2 E.U./dL

## 2017-11-30 NOTE — Progress Notes (Signed)
Subjective:    Todd Davis is a 32 y.o. male who presents to [facility] today for chest pain:  1.  Chest pain:  Longstanding issue for patient.  His chest pain almost every single day.  Describes his sharp stabbing pain that is retrosternal.  Rarely radiates.  Hurts worse when he changes position with some he stands up straight.  Also has pain sometimes when he is climbing up large hill at work.  Has had echo in the past which was normal.  Also numerous normal EKGs.  He has never had a stress test although this is been recommended in the past.  Denies any current back pain although he has suffered from this in the past.  2.  Dysuria:  Present last week.  "Very bad" for 3 days or so.  That initial burning that was the worst has resolved, but he has continued to have some baseline sharp pain in his penis since then.  No discharge.  No lesions that he has noted.  No abdominal pain.  Although he has no new sexual partners he is unsure about his girlfriend.  Would like to have a full STD check today.   ROS as above per HPI.    The following portions of the patient's history were reviewed and updated as appropriate: allergies, current medications, past medical history, family and social history, and problem list. Patient is a nonsmoker.    PMH reviewed.  Past Medical History:  Diagnosis Date  . GERD (gastroesophageal reflux disease)   . Hypertension    prehypertension  . Renal disorder    kidney stones   No past surgical history on file.  Medications reviewed. Current Outpatient Medications  Medication Sig Dispense Refill  . lisinopril (PRINIVIL,ZESTRIL) 20 MG tablet Take 1 tablet (20 mg total) by mouth daily. 90 tablet 1  . lisinopril (PRINIVIL,ZESTRIL) 20 MG tablet TAKE (1) TABLET BY MOUTH ONCE DAILY. 30 tablet 3   No current facility-administered medications for this visit.      Objective:   Physical Exam BP 128/88   Pulse (!) 53   Temp 98.3 F (36.8 C) (Oral)   Ht 6\' 6"  (1.981 m)    Wt (!) 353 lb 6.4 oz (160.3 kg)   SpO2 98%   BMI 40.84 kg/m  Gen:  Alert, cooperative patient who appears stated age in no acute distress.  Vital signs reviewed. HEENT: EOMI,  MMM Cardiac:  Regular rate and rhythm without murmur auscultated.  Good S1/S2.  Chest: Nontender throughout palpation of sternum and manubrium. Pulm:  Clear to auscultation bilaterally with good air movement.  No wheezes or rales noted.   Abd:  Soft/nondistended/nontender.  Good bowel sounds throughout all four quadrants.  No masses noted.  Exts: Non edematous BL  LE, warm and well perfused.  GU: Uncircumcised male.  No lesions noted on penis or scrotum.  No inguinal hernias noted.  No lymphadenopathy noted bilaterally.  Testicles are distended.  Nontender.  No masses noted.  No results found for this or any previous visit (from the past 72 hour(s)).

## 2017-11-30 NOTE — Patient Instructions (Signed)
It was good to see you again today.  I think that getting you back to see cardiology is a good idea, just to let us know.    I'm sending you for a chest xray as well.  [facility].  You do not need an appointment.  I'll call you with the results.

## 2017-12-01 ENCOUNTER — Encounter: Payer: Self-pay | Admitting: Family Medicine

## 2017-12-01 DIAGNOSIS — R079 Chest pain, unspecified: Secondary | ICD-10-CM | POA: Insufficient documentation

## 2017-12-01 DIAGNOSIS — R300 Dysuria: Secondary | ICD-10-CM | POA: Insufficient documentation

## 2017-12-01 DIAGNOSIS — R0789 Other chest pain: Secondary | ICD-10-CM | POA: Insufficient documentation

## 2017-12-01 HISTORY — DX: Chest pain, unspecified: R07.9

## 2017-12-01 HISTORY — DX: Dysuria: R30.0

## 2017-12-01 LAB — BASIC METABOLIC PANEL
BUN/Creatinine Ratio: 11 (ref 9–20)
BUN: 12 mg/dL (ref 6–20)
CALCIUM: 9.5 mg/dL (ref 8.7–10.2)
CHLORIDE: 101 mmol/L (ref 96–106)
CO2: 26 mmol/L (ref 20–29)
Creatinine, Ser: 1.06 mg/dL (ref 0.76–1.27)
GFR, EST AFRICAN AMERICAN: 107 mL/min/{1.73_m2} (ref >59)
GFR, EST NON AFRICAN AMERICAN: 92 mL/min/{1.73_m2} (ref >59)
Glucose: 95 mg/dL (ref 65–99)
Potassium: 4.1 mmol/L (ref 3.5–5.2)
Sodium: 140 mmol/L (ref 134–144)

## 2017-12-01 LAB — RPR: RPR Ser Ql: NONREACTIVE

## 2017-12-01 LAB — CBC WITH DIFFERENTIAL/PLATELET
BASOS: 1 %
Basophils Absolute: 0.1 10*3/uL (ref 0.0–0.2)
EOS (ABSOLUTE): 0.1 10*3/uL (ref 0.0–0.4)
EOS: 1 %
HEMATOCRIT: 41.7 % (ref 37.5–51.0)
Hemoglobin: 14.2 g/dL (ref 13.0–17.7)
Immature Grans (Abs): 0 10*3/uL (ref 0.0–0.1)
Immature Granulocytes: 0 %
LYMPHS: 30 %
Lymphocytes Absolute: 2.2 10*3/uL (ref 0.7–3.1)
MCH: 28 pg (ref 26.6–33.0)
MCHC: 34.1 g/dL (ref 31.5–35.7)
MCV: 82 fL (ref 79–97)
MONOCYTES: 7 %
Monocytes Absolute: 0.5 10*3/uL (ref 0.1–0.9)
NEUTROS ABS: 4.3 10*3/uL (ref 1.4–7.0)
NEUTROS PCT: 61 %
Platelets: 304 10*3/uL (ref 150–450)
RBC: 5.07 x10E6/uL (ref 4.14–5.80)
RDW: 14.2 % (ref 12.3–15.4)
WBC: 7.1 10*3/uL (ref 3.4–10.8)

## 2017-12-01 LAB — HIV ANTIBODY (ROUTINE TESTING W REFLEX): HIV Screen 4th Generation wRfx: NONREACTIVE

## 2017-12-01 LAB — CERVICOVAGINAL ANCILLARY ONLY
Chlamydia: NEGATIVE
Neisseria Gonorrhea: NEGATIVE

## 2017-12-01 LAB — HEPATITIS C ANTIBODY: Hep C Virus Ab: <0.1 s/co ratio (ref 0.0–0.9)

## 2017-12-01 LAB — HEPATITIS B CORE AB W/REFLEX: Hep B Core Total Ab: NEGATIVE

## 2017-12-01 NOTE — Assessment & Plan Note (Signed)
Checking both urinalysis as well as urine GC chlamydia. -Also going to check HIV and hepatitis today as patient desires full STD testing. -No evidence for herpes or other lesions on exam.

## 2017-12-01 NOTE — Assessment & Plan Note (Addendum)
Persistent.  This is been treated as costochondritis in the past as well as sequelae of back pain.  -Atypical chest pain.  Does have it fairly regularly on exertion.  Not tender to palpation.  But it is worsened with position mostly when he sits up straight.-I do think a referral to cardiology is warranted for a stress test.  He has no family history of early coronary disease and I think this is most likely continuing to be costochondritis, however it is difficult to ignore the fact that exertion causes his chest pain fairly consistently and is relieved with rest.  I am going to refer him to cardiology today. -Another thought would be pericarditis although I think this is much less likely as this is been going on for years and his EKG has not shown consistent changes. -We will obtain chest x-ray as he has never had this before

## 2017-12-02 NOTE — Addendum Note (Signed)
Addended byGwendolyn Grant, Newt Lukes on: 12/02/2017 03:14 PM   Modules accepted: Orders

## 2017-12-05 ENCOUNTER — Ambulatory Visit
Admission: RE | Admit: 2017-12-05 | Discharge: 2017-12-05 | Disposition: A | Discharge status: Home / Self Care | Payer: PRIVATE HEALTH INSURANCE | Source: Ambulatory Visit | Attending: Family Medicine | Admitting: Family Medicine

## 2017-12-05 DIAGNOSIS — R079 Chest pain, unspecified: Secondary | ICD-10-CM

## 2017-12-08 ENCOUNTER — Encounter: Payer: Self-pay | Admitting: Family Medicine

## 2017-12-26 ENCOUNTER — Encounter: Payer: Self-pay | Admitting: Cardiology

## 2017-12-26 ENCOUNTER — Ambulatory Visit (INDEPENDENT_AMBULATORY_CARE_PROVIDER_SITE_OTHER): Payer: PRIVATE HEALTH INSURANCE | Admitting: Cardiology

## 2017-12-26 VITALS — BP 122/86 | HR 63 | Ht 78.0 in | Wt 350.8 lb

## 2017-12-26 DIAGNOSIS — R0789 Other chest pain: Secondary | ICD-10-CM

## 2017-12-26 DIAGNOSIS — I10 Essential (primary) hypertension: Secondary | ICD-10-CM

## 2017-12-26 NOTE — Patient Instructions (Addendum)
Medication Instructions:  The current medical regimen is effective;  continue present plan and medications.  Testing/Procedures: Your physician has requested that you have an exercise tolerance test. For further information please visit [email or web address]. Please also follow instruction sheet, as given.  Follow-Up: Follow up as needed after the above testing.  If you need a refill on your cardiac medications before your next appointment, please call your pharmacy.  Thank you for choosing [facility]!!     

## 2017-12-26 NOTE — Progress Notes (Signed)
Cardiology Office Note:    Date:  12/26/2017   ID:  Todd Davis, DOB 1985-09-18, MRN [id number]  PCP:  Tobey Grim, MD  Cardiologist:  No primary care provider on file.  Electrophysiologist:  None   Referring MD: Tobey Grim, MD     History of Present Illness:    Todd Davis is a 32 y.o. male here for the evaluation of chest pain at the request of Dr. Gwendolyn Grant.  In review of prior office note from 11/30/2017, he was having chest pain issues almost every day for quite some time, 2-3 years.  Sharp, stabbing, retrosternal.  Dull, left shoulder. Hurts when changing position, standing up for instance.  Pain when climbing uphill at work.  Echocardiogram was done in the past as well as numerous EKGs.  He has never had a stress test although this has been recommended.  Back pain periodically.  He is here today with his significant other.  Non-smoker, no early family history of cardiac disease  Past Medical History:  Diagnosis Date  . Bilateral hand numbness 10/26/2016  . Cerumen impaction 10/26/2016  . Chest pain 12/01/2017  . Costochondritis 10/26/2016  . Demyelinating changes in brain (HCC) 10/26/2016  . Dysuria 12/01/2017  . GERD (gastroesophageal reflux disease)   . Hypertension    prehypertension  . Leg mass, left 10/26/2016  . Light headedness 07/26/2017  . Renal disorder    kidney stones    History reviewed. No pertinent surgical history.  Current Medications: Current Meds  Medication Sig  . propranolol (INDERAL) 10 MG tablet Take 10 mg by mouth as needed.     Allergies:   Patient has no known allergies.   Social History   Socioeconomic History  . Marital status: Single    Spouse name: Not on file  . Number of children: Not on file  . Years of education: Not on file  . Highest education level: Not on file  Occupational History  . Not on file  Social Needs  . Financial resource strain: Not on file  . Food insecurity:    Worry: Not on file   Inability: Not on file  . Transportation needs:    Medical: Not on file    Non-medical: Not on file  Tobacco Use  . Smoking status: Never Smoker  . Smokeless tobacco: Never Used  Substance and Sexual Activity  . Alcohol use: No  . Drug use: No  . Sexual activity: Not on file  Lifestyle  . Physical activity:    Days per week: Not on file    Minutes per session: Not on file  . Stress: Not on file  Relationships  . Social connections:    Talks on phone: Not on file    Gets together: Not on file    Attends religious service: Not on file    Active member of club or organization: Not on file    Attends meetings of clubs or organizations: Not on file    Relationship status: Not on file  Other Topics Concern  . Not on file  Social History Narrative  . Not on file     Family History: The patient's family history includes Asperger's syndrome in his brother; Cerebral aneurysm in his paternal grandfather; Depression in his sister; Heart murmur in his maternal grandfather; Hypertension in his father and maternal grandmother.  ROS:   Please see the history of present illness.    Chest pain, shortness of breath, chest pressure all other systems reviewed  and are negative.  EKGs/Labs/Other Studies Reviewed:    The following studies were reviewed today: ECHO 2017 - normal.   EKG:  EKG is  ordered today.  The ekg ordered today demonstrates 12/26/2017-sinus rhythm 63 with no other abnormalities  Recent Labs: 05/24/2017: ALT 34; TSH 1.380 11/30/2017: BUN 12; Creatinine, Ser 1.06; Hemoglobin 14.2; Platelets 304; Potassium 4.1; Sodium 140  Recent Lipid Panel    Component Value Date/Time   CHOL 141 05/24/2017 1525   TRIG 131 05/24/2017 1525   HDL 39 (L) 05/24/2017 1525   CHOLHDL 3.6 05/24/2017 1525   LDLCALC 76 05/24/2017 1525    Physical Exam:    VS:  BP 122/86   Pulse 63   Ht 6\' 6"  (1.981 m)   Wt (!) 350 lb 12.8 oz (159.1 kg)   BMI 40.54 kg/m     Wt Readings from Last 3  Encounters:  12/26/17 (!) 350 lb 12.8 oz (159.1 kg)  11/30/17 (!) 353 lb 6.4 oz (160.3 kg)  07/22/17 (!) 361 lb 9.6 oz (164 kg)     GEN:  Well nourished, well developed in no acute distress, obese HEENT: Normal NECK: No JVD; No carotid bruits LYMPHATICS: No lymphadenopathy CARDIAC: RRR, no murmurs, rubs, gallops RESPIRATORY:  Clear to auscultation without rales, wheezing or rhonchi  ABDOMEN: Soft, non-tender, non-distended MUSCULOSKELETAL:  No edema; No deformity  SKIN: Warm and dry NEUROLOGIC:  Alert and oriented x 3 PSYCHIATRIC:  Normal affect   ASSESSMENT:    1. Atypical chest pain   2. Morbid obesity (HCC)   3. Essential hypertension    PLAN:    In order of problems listed above:  Atypical chest pain -Chest pain does sound fairly musculoskeletal by characteristics.  Prior echocardiogram in 2017 was unremarkable per his report.  It was suggested that he have an exercise treadmill test done and this does not sound unreasonable, pretest probability is low.  We will go ahead and set this up for him.  Morbid obesity -Continue to encourage weight loss.  Decrease carbohydrates.  Essential hypertension -Continue with lisinopril 20 mg a day.   Medication Adjustments/Labs and Tests Ordered: Current medicines are reviewed at length with the patient today.  Concerns regarding medicines are outlined above.  Orders Placed This Encounter  Procedures  . EXERCISE TOLERANCE TEST (ETT)  . EKG 12-Lead   No orders of the defined types were placed in this encounter.   Patient Instructions  Medication Instructions:  The current medical regimen is effective;  continue present plan and medications.  Testing/Procedures: Your physician has requested that you have an exercise tolerance test. For further information please visit [email or web address] Please also follow instruction sheet, as given.  Follow-Up: Follow up as needed after the above testing.  If you need a refill on your  cardiac medications before your next appointment, please call your pharmacy.  Thank you for choosing [facility]!!        Signed, Donato Schultz, MD  12/26/2017 12:23 PM    [facility]

## 2017-12-30 ENCOUNTER — Ambulatory Visit (INDEPENDENT_AMBULATORY_CARE_PROVIDER_SITE_OTHER): Payer: PRIVATE HEALTH INSURANCE

## 2017-12-30 DIAGNOSIS — I10 Essential (primary) hypertension: Secondary | ICD-10-CM

## 2017-12-30 DIAGNOSIS — R0789 Other chest pain: Secondary | ICD-10-CM | POA: Diagnosis not present

## 2017-12-30 LAB — EXERCISE TOLERANCE TEST
CHL CUP RESTING HR STRESS: 72 {beats}/min
CHL RATE OF PERCEIVED EXERTION: 18
CSEPEDS: 5 s
CSEPEW: 8.6 METS
CSEPHR: 90 %
Exercise duration (min): 7 min
MPHR: 188 {beats}/min
Peak HR: 171 {beats}/min

## 2018-01-02 ENCOUNTER — Telehealth: Payer: Self-pay

## 2018-01-02 NOTE — Telephone Encounter (Signed)
-----   Message from Jake Bathe, MD sent at 12/31/2017  6:53 AM EDT ----- Excellent stress test.  Donato Schultz, MD

## 2018-01-02 NOTE — Telephone Encounter (Signed)
Notes recorded by Sigurd Sos, RN on 01/02/2018 at 10:15 AM EDT The patient has been notified of the result and verbalized understanding. All questions (if any) were answered. Sigurd Sos, RN 01/02/2018 10:15 AM

## 2018-01-02 NOTE — Telephone Encounter (Signed)
Notes recorded by Sigurd Sos, RN on 01/02/2018 at 9:00 AM EDT [email or web address] 10/7 ------

## 2018-01-02 NOTE — Telephone Encounter (Signed)
-----   Message from Mark C Skains, MD sent at 12/31/2017  6:53 AM EDT ----- Excellent stress test.  Mark Skains, MD  

## 2018-01-17 ENCOUNTER — Other Ambulatory Visit: Payer: Self-pay | Admitting: Family Medicine

## 2018-02-21 ENCOUNTER — Encounter: Payer: Self-pay | Admitting: Family Medicine

## 2018-02-22 ENCOUNTER — Other Ambulatory Visit: Payer: Self-pay

## 2018-02-22 MED ORDER — LISINOPRIL 20 MG PO TABS: ORAL_TABLET | ORAL | 1 refills | Status: DC

## 2018-03-09 ENCOUNTER — Ambulatory Visit: Payer: PRIVATE HEALTH INSURANCE

## 2018-04-18 ENCOUNTER — Ambulatory Visit: Payer: PRIVATE HEALTH INSURANCE

## 2018-04-19 ENCOUNTER — Encounter: Payer: Self-pay | Admitting: Family Medicine

## 2018-04-19 ENCOUNTER — Telehealth: Payer: Self-pay | Admitting: Cardiology

## 2018-04-19 ENCOUNTER — Ambulatory Visit (INDEPENDENT_AMBULATORY_CARE_PROVIDER_SITE_OTHER): Payer: PRIVATE HEALTH INSURANCE | Admitting: Family Medicine

## 2018-04-19 ENCOUNTER — Ambulatory Visit (HOSPITAL_COMMUNITY)
Admission: RE | Admit: 2018-04-19 | Discharge: 2018-04-19 | Disposition: A | Discharge status: Home / Self Care | Payer: PRIVATE HEALTH INSURANCE | Source: Ambulatory Visit | Attending: Family Medicine | Admitting: Family Medicine

## 2018-04-19 VITALS — BP 140/82 | HR 82 | Temp 97.7°F | Ht 78.0 in | Wt 367.0 lb

## 2018-04-19 DIAGNOSIS — R079 Chest pain, unspecified: Secondary | ICD-10-CM | POA: Insufficient documentation

## 2018-04-19 DIAGNOSIS — R0609 Other forms of dyspnea: Secondary | ICD-10-CM | POA: Diagnosis not present

## 2018-04-19 DIAGNOSIS — E66813 Obesity, class 3: Secondary | ICD-10-CM | POA: Insufficient documentation

## 2018-04-19 NOTE — Patient Instructions (Signed)
If you have worsening chest pain or chest pain lasting longer than 5 minutes please report to the emergency department.  Please call Dr. Anne FuSkains office at [phone] to follow up regarding this   Go to the lab today to check your blood count  I think your weight gain may be in part contributing to your lowered exercise capacity.  Carefully monitoring what you eat and trying for 30 minutes of exercise 5 times a week can help to reduce your body weight.  There is a long list of dietary recommendations below you will have to follow all of them--- choose what works for you. Try to limit sugar sweetened beverages.   It was wonderful to see you today.  [facility].   Please call [phone] with any questions about today's appointment.  Please be sure to schedule follow up at the front  desk before you leave today.   Terisa Starrarina Emrey Thornley, MD  Family Medicine       Mediterranean Diet  Why follow it? Research shows. . Those who follow the Mediterranean diet have a reduced risk of heart disease  . The diet is associated with a reduced incidence of Parkinson's and Alzheimer's diseases . People following the diet may have longer life expectancies and lower rates of chronic diseases  . The Dietary Guidelines for Americans recommends the Mediterranean diet as an eating plan to promote health and prevent disease  What Is the Mediterranean Diet?  . Healthy eating plan based on typical foods and recipes of Mediterranean-style cooking . The diet is primarily a plant based diet; these foods should make up a majority of meals   Starches - Plant based foods should make up a majority of meals - They are an important sources of vitamins, minerals, energy, antioxidants, and fiber - Choose whole grains, foods high in fiber and minimally processed items  - Typical grain sources include wheat, oats, barley, corn, [place] rice, bulgar, farro, millet, polenta, couscous  -  Various types of beans include chickpeas, lentils, fava beans, black beans, white beans   Fruits  Veggies - Large quantities of antioxidant rich fruits & veggies; 6 or more servings  - Vegetables can be eaten raw or lightly drizzled with oil and cooked  - Vegetables common to the traditional Mediterranean Diet include: artichokes, arugula, beets, broccoli, brussel sprouts, cabbage, carrots, celery, collard greens, cucumbers, eggplant, kale, leeks, lemons, lettuce, mushrooms, okra, onions, peas, peppers, potatoes, pumpkin, radishes, rutabaga, shallots, spinach, sweet potatoes, turnips, zucchini - Fruits common to the Mediterranean Diet include: apples, apricots, avocados, cherries, clementines, dates, figs, grapefruits, grapes, melons, nectarines, oranges, peaches, pears, pomegranates, strawberries, tangerines  Fats - Replace butter and margarine with healthy oils, such as olive oil, canola oil, and tahini  - Limit nuts to no more than a handful a day  - Nuts include walnuts, almonds, pecans, pistachios, pine nuts  - Limit or avoid candied, honey roasted or heavily salted nuts - Olives are central to the [employer]iterranean diet - can be eaten whole or used in a variety of dishes   Meats Protein - Limiting red meat: no more than a few times a month - When eating red meat: choose lean cuts and keep the portion to the size of deck of cards - Eggs: approx. 0 to 4 times a week  - Fish and lean poultry: at least 2 a week  - Healthy protein sources include, chicken, [place], lean beef, lamb - Increase intake of  seafood such as tuna, salmon, trout, mackerel, shrimp, scallops - Avoid or limit high fat processed meats such as sausage and bacon  Dairy - Include moderate amounts of low fat dairy products  - Focus on healthy dairy such as fat free yogurt, skim milk, low or reduced fat cheese - Limit dairy products higher in fat such as whole or 2% milk, cheese, ice cream  Alcohol - Moderate amounts of red wine is ok   - No more than 5 oz daily for women (all ages) and men older than age 40  - No more than 10 oz of wine daily for men younger than 40  Other - Limit sweets and other desserts  - Use herbs and spices instead of salt to flavor foods  - Herbs and spices common to the traditional Mediterranean Diet include: basil, bay leaves, chives, cloves, cumin, fennel, garlic, lavender, marjoram, mint, oregano, parsley, pepper, rosemary, sage, savory, sumac, tarragon, thyme   It's not just a diet, it's a lifestyle:  . The Mediterranean diet includes lifestyle factors typical of those in the region  . Foods, drinks and meals are best eaten with others and savored . Daily physical activity is important for overall good health . This could be strenuous exercise like running and aerobics . This could also be more leisurely activities such as walking, housework, yard-work, or taking the stairs . Moderation is the key; a balanced and healthy diet accommodates most foods and drinks . Consider portion sizes and frequency of consumption of certain foods   Meal Ideas & Options:  . Breakfast:  o Whole wheat toast or whole wheat English muffins with peanut butter & hard boiled egg o Steel cut oats topped with apples & cinnamon and skim milk  o Fresh fruit: banana, strawberries, melon, berries, peaches  o Smoothies: strawberries, bananas, greek yogurt, peanut butter o Low fat greek yogurt with blueberries and granola  o Egg white omelet with spinach and mushrooms o Breakfast couscous: whole wheat couscous, apricots, skim milk, cranberries  . Sandwiches:  o Hummus and grilled vegetables (peppers, zucchini, squash) on whole wheat bread   o Grilled chicken on whole wheat pita with lettuce, tomatoes, cucumbers or tzatziki  o Tuna salad on whole wheat bread: tuna salad made with greek yogurt, olives, red peppers, capers, green onions o Garlic rosemary lamb pita: lamb sauted with garlic, rosemary, salt & pepper; add  lettuce, cucumber, greek yogurt to pita - flavor with lemon juice and black pepper  . Seafood:  o Mediterranean grilled salmon, seasoned with garlic, basil, parsley, lemon juice and black pepper o Shrimp, lemon, and spinach whole-grain pasta salad made with low fat greek yogurt  o Seared scallops with lemon orzo  o Seared tuna steaks seasoned salt, pepper, coriander topped with tomato mixture of olives, tomatoes, olive oil, minced garlic, parsley, green onions and cappers  . Meats:  o Herbed greek chicken salad with kalamata olives, cucumber, feta  o Red bell peppers stuffed with spinach, bulgur, lean ground beef (or lentils) & topped with feta   o Kebabs: skewers of chicken, tomatoes, onions, zucchini, squash  o [place] burgers: made with red onions, mint, dill, lemon juice, feta cheese topped with roasted red peppers . Vegetarian o Cucumber salad: cucumbers, artichoke hearts, celery, red onion, feta cheese, tossed in olive oil & lemon juice  o Hummus and whole grain pita points with a greek salad (lettuce, tomato, feta, olives, cucumbers, red onion) o Lentil soup with celery, carrots made with  vegetable broth, garlic, salt and pepper  o Tabouli salad: parsley, bulgur, mint, scallions, cucumbers, tomato, radishes, lemon juice, olive oil, salt and pepper.

## 2018-04-19 NOTE — Telephone Encounter (Signed)
New Message   Received message via my chart from patient wanting an appointment due to having chest pain and reached out to schedule patient but he didn't answer.  Forwarding to you instead of triage, because of the red flag chest pain.

## 2018-04-19 NOTE — Telephone Encounter (Signed)
Attempted to contact patient to discuss the c/o chest pain.  Left voicemail for pt to call back to discuss or schedule appointment as necessary.  He was seen and evaluated today for non-specific chest pain and dyspnea on exertion by Terisa Starr, MD.  She asked pt to f/u with Dr Anne Fu.  Please see that note for further details.

## 2018-04-19 NOTE — Progress Notes (Signed)
Patient Name: Todd Davis Date of Birth: January 26, 1986 Date of Visit: 04/19/18 PCP: Tobey Grim, MD  Chief Complaint: chest pain earlier this week  Subjective: Todd Davis is a pleasant 33 y.o. year old with history significant for hypertension and obesity presenting today for recurrence of his chest pain.  The patient has intermittent chest pain at baseline.  The chest pain occurs usually once per week and last between several seconds to a minute.  The chest pain comes from the left side of his back forward.  On Monday he had an episode that lasted longer than usual.  He reported the intensity was about the same.  It was located in the left side of his chest.  It was unassociated with other symptoms including dyspnea, palpitations, arm or neck pain, nausea vomiting.  He reports he was sitting down at his desk and was simply talking to his girlfriend.  He had just eaten a meal.  He reports the conversation was normal and not intense or emotional at all.  He reports he was relaxed during the conversation.  He denies dyspnea with the episode.  The patient does report several months of worsening dyspnea on exertion.  He is able to walk on flat ground for a very long distance.  He reports when he tries to walk up stairs he feels like he is at times wheezing.  He does not have chest pain with these episodes. He has obstructive sleep apnea and is not using his CPAP.  He has gained 17 pounds (in part, possibly due to clothing today).  He denies orthopnea, paroxysmal nocturnal dyspnea, chest pain with exertion, history of asthma or lower extremity edema.  He reports he is claustrophobia related to the CPAP and he breathes through his oral cavity at night.  He has tried a nasal pillow and the chinstrap this does not work either.  ROS:  ROS As above I have reviewed the patient's medical, surgical, family, and social history as appropriate.  Medical history significant for anxiety around speaking  events and hypertension medical history also notable for obstructive sleep apnea and obesity  The patient is a non-smoker.  He does not use excess alcohol.  He does not use vaping devices or other forms of smokeless tobacco.  The patient works at [employer].  He reports he is in a stable relationship with minimal stress.  He reports his current partner is very active and likes hiking and healthy eating.  He reports a family history of myocardial infarction in his grandfather in his 63s.  There is no family history of premature coronary artery disease  Vitals:   04/19/18 0845  BP: 140/82  Pulse: 82  Temp: 97.7 F (36.5 C)  SpO2: 97%   Filed Weights   04/19/18 0845  Weight: (!) 367 lb (166.5 kg)   HEENT: Sclera anicteric. Dentition is moderate. Appears well hydrated. Neck: Supple Cardiac: Regular rate and rhythm. Normal S1/S2. No murmurs, rubs, or gallops appreciated. Lungs: Clear bilaterally to ascultation.  Extremities: Warm, well perfused without edema.  Skin: Warm, dry Psych: Pleasant and appropriate  EKG unchanged from prior, inferior leads with low voltage, no ST/T wave     Mahin was seen today for chest pain.  Diagnoses and all orders for this visit:  Nonspecific chest pain the patient had a echocardiogram in 2017.  He had a normal stress test within the last 6 months.  He has previously seen cardiology for this.  His chest pain earlier  this week was nonexertional and nonspecific.  He has not had recurrence of this and does not have chest pain at this time.  I gave him ED precautions for the chest pain if it lasted longer than 5 minutes or was associated with exertion and lasted longer.  My suspicion for cardiac etiology of his chest pain given his evaluation negative stress test is low.  Given his dyspnea and wheezing he could have an asthma component.  He has no history of asthma and it would seem less likely to develop this in his 30s.  Differential also  includes esophageal spasm, as these episodes frequently occur after meals and the intensity and duration of the spasm would be consistent with this.  I recommended he call Dr. Anne Fu to follow-up and check in regarding  his chest pain and schedule spirometry.  -     EKG 12-Lead -     Recommended follow up with Cardiology.  -     Recommended Spirometry    Dyspnea on exertion the patient has had a prior echocardiogram which was within normal limits.  He has no signs or symptoms suggestive of heart failure on my examination today.  He has no risk factors or signs or symptoms of pulmonary embolism.  Could have asthma as detailed above, although I think this is less likely given his age.  More likely is a his weight gain as well as obstructive sleep apnea which is currently untreated.  Anemia could also be playing a role will obtain as below. Discussed weight gain, healthy eating habits at length.  -     CBC  Terisa Starr, MD  Family Medicine Teaching Service

## 2018-04-20 LAB — CBC
HEMATOCRIT: 40.4 % (ref 37.5–51.0)
HEMOGLOBIN: 13.4 g/dL (ref 13.0–17.7)
MCH: 27.3 pg (ref 26.6–33.0)
MCHC: 33.2 g/dL (ref 31.5–35.7)
MCV: 82 fL (ref 79–97)
Platelets: 309 10*3/uL (ref 150–450)
RBC: 4.91 x10E6/uL (ref 4.14–5.80)
RDW: 13 % (ref 11.6–15.4)
WBC: 7.7 10*3/uL (ref 3.4–10.8)

## 2018-04-27 NOTE — Telephone Encounter (Signed)
Spoke with pt who reports he is still having some fleeting chest pain.  He would like to f/u with Dr Anne Fu for further evaluation of this.  appt scheduled for 05/02/2018 @ 2:20 pm.  Pt aware and is agreeable.  Aware to arrive early for check in.

## 2018-05-01 ENCOUNTER — Ambulatory Visit: Payer: PRIVATE HEALTH INSURANCE | Admitting: Pharmacist

## 2018-05-02 ENCOUNTER — Encounter: Payer: Self-pay | Admitting: *Deleted

## 2018-05-02 ENCOUNTER — Encounter: Payer: Self-pay | Admitting: Cardiology

## 2018-05-02 ENCOUNTER — Ambulatory Visit (INDEPENDENT_AMBULATORY_CARE_PROVIDER_SITE_OTHER): Payer: PRIVATE HEALTH INSURANCE | Admitting: Cardiology

## 2018-05-02 VITALS — BP 112/70 | HR 92 | Ht 78.0 in | Wt 361.0 lb

## 2018-05-02 DIAGNOSIS — R0789 Other chest pain: Secondary | ICD-10-CM | POA: Diagnosis not present

## 2018-05-02 DIAGNOSIS — Z01812 Encounter for preprocedural laboratory examination: Secondary | ICD-10-CM

## 2018-05-02 DIAGNOSIS — I10 Essential (primary) hypertension: Secondary | ICD-10-CM | POA: Diagnosis not present

## 2018-05-02 MED ORDER — METOPROLOL TARTRATE 100 MG PO TABS: 100.0000 mg | ORAL_TABLET | Freq: Once | ORAL | 0 refills | Status: DC

## 2018-05-02 NOTE — Patient Instructions (Signed)
Medication Instructions:  The current medical regimen is effective;  continue present plan and medications.  If you need a refill on your cardiac medications before your next appointment, please call your pharmacy.   Lab work: Please have blood work before your Coronary CT scan. (BMP) If you have labs (blood work) drawn today and your tests are completely normal, you will receive your results only by: Marland Kitchen MyChart Message (if you have MyChart) OR . A paper copy in the mail If you have any lab test that is abnormal or we need to change your treatment, we will call you to review the results.  Testing/Procedures: Your physician has requested that you have Coronary CT. Cardiac computed tomography (CT) is a painless test that uses an x-ray machine to take clear, detailed pictures of your heart. For further information please visit [email or web address] Please follow instruction sheet as given.  Follow-Up: Further follow up will be based on the results of the above testing.  Thank you for choosing [facility]!!

## 2018-05-02 NOTE — Progress Notes (Signed)
Cardiology Office Note:    Date:  05/02/2018   ID:  Todd Davis, DOB Aug 01, 1985, MRN [id number]  PCP:  Tobey GrimWalden, Jeffrey H, MD  Cardiologist:  Donato SchultzMark Treylon Henard, MD  Electrophysiologist:  None   Referring MD: Tobey GrimWalden, Jeffrey H, MD     History of Present Illness:    Todd Davis is a 33 y.o. male here for the evaluation of chest pain at the request of Dr. Gwendolyn GrantWalden.  In review of prior office note from 11/30/2017, he was having chest pain issues almost every day for quite some time, 2-3 years.  Sharp, stabbing, retrosternal.  Dull, left shoulder. Hurts when changing position, standing up for instance.  Pain when climbing uphill at work.  Echocardiogram was done in the past as well as numerous EKGs.  He has never had a stress test although this has been recommended.  Back pain periodically.  He is here today with his significant other.  Non-smoker, no early family history of cardiac disease  05/02/2018-he is back today for the evaluation of recurrent chest pain.  Intermittent.  Once per week.  Can last up to 1 minutes duration.  Left-sided.  He was sitting down at his desk at one point talking with his girlfriend had just eaten a meal and and started to have more intense left-sided discomfort.  Shortness of breath has worsened.  When he tries to go upstairs he feels like he is going to wheeze.  Continues to gain some weight.  Claustrophobia with CPAP.  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  Had prior echocardiogram in 2017 that was reportedly unremarkable.  Past Medical History:  Diagnosis Date  . Bilateral hand numbness 10/26/2016  . Cerumen impaction 10/26/2016  . Chest pain 12/01/2017  . Costochondritis 10/26/2016  . Demyelinating changes in brain (HCC) 10/26/2016  . Dysuria 12/01/2017  . GERD (gastroesophageal reflux disease)   . Hypertension    prehypertension  . Leg mass, left 10/26/2016  . Light headedness 07/26/2017  . Renal disorder    kidney stones    No past surgical history  on file.  Current Medications: Current Meds  Medication Sig  . aspirin 81 MG chewable tablet Chew 81 mg by mouth as needed.  Marland Kitchen. lisinopril (PRINIVIL,ZESTRIL) 20 MG tablet TAKE (1) TABLET BY MOUTH ONCE DAILY.  Marland Kitchen. OMEPRAZOLE PO Take 40 mg by mouth every 3 (three) days.     Allergies:   Patient has no known allergies.   Social History   Socioeconomic History  . Marital status: Single    Spouse name: Not on file  . Number of children: Not on file  . Years of education: Not on file  . Highest education level: Not on file  Occupational History  . Not on file  Social Needs  . Financial resource strain: Not on file  . Food insecurity:    Worry: Not on file    Inability: Not on file  . Transportation needs:    Medical: Not on file    Non-medical: Not on file  Tobacco Use  . Smoking status: Never Smoker  . Smokeless tobacco: Never Used  Substance and Sexual Activity  . Alcohol use: No  . Drug use: No  . Sexual activity: Not on file  Lifestyle  . Physical activity:    Days per week: Not on file    Minutes per session: Not on file  . Stress: Not on file  Relationships  . Social connections:    Talks on phone: Not  on file    Gets together: Not on file    Attends religious service: Not on file    Active member of club or organization: Not on file    Attends meetings of clubs or organizations: Not on file    Relationship status: Not on file  Other Topics Concern  . Not on file  Social History Narrative  . Not on file     Family History: The patient's family history includes Asperger's syndrome in his brother; Cerebral aneurysm in his paternal grandfather; Depression in his sister; Heart murmur in his maternal grandfather; Hypertension in his father and maternal grandmother.  ROS:   Please see the history of present illness.    Chest pain, shortness of breath, chest pressure all other systems reviewed and are negative.  EKGs/Labs/Other Studies Reviewed:    The following  studies were reviewed today: ECHO 2017 - normal.  ETT 2019-normal  EKG:  EKG is  ordered today.  The ekg ordered today demonstrates 12/26/2017-sinus rhythm 63 with no other abnormalities  Recent Labs: 05/24/2017: ALT 34; TSH 1.380 11/30/2017: BUN 12; Creatinine, Ser 1.06; Potassium 4.1; Sodium 140 04/19/2018: Hemoglobin 13.4; Platelets 309  Recent Lipid Panel    Component Value Date/Time   CHOL 141 05/24/2017 1525   TRIG 131 05/24/2017 1525   HDL 39 (L) 05/24/2017 1525   CHOLHDL 3.6 05/24/2017 1525   LDLCALC 76 05/24/2017 1525    Physical Exam:    VS:  BP 112/70   Pulse 92   Ht 6\' 6"  (1.981 m)   Wt (!) 361 lb (163.7 kg)   SpO2 96%   BMI 41.72 kg/m     Wt Readings from Last 3 Encounters:  05/02/18 (!) 361 lb (163.7 kg)  04/19/18 (!) 367 lb (166.5 kg)  12/26/17 (!) 350 lb 12.8 oz (159.1 kg)     GEN: Well nourished, well developed, in no acute distress, obese HEENT: normal  Neck: no JVD, carotid bruits, or masses Cardiac: RRR; no murmurs, rubs, or gallops,no edema  Respiratory:  clear to auscultation bilaterally, normal work of breathing GI: soft, nontender, nondistended, + BS MS: no deformity or atrophy  Skin: warm and dry, no rash Neuro:  Alert and Oriented x 3, Strength and sensation are intact Psych: euthymic mood, full affect   ASSESSMENT:    1. Atypical chest pain   2. Essential hypertension   3. Pre-procedure lab exam   4. Morbid obesity (HCC)    PLAN:    In order of problems listed above:  Atypical chest pain -Chest pain does sound fairly musculoskeletal by characteristics.  Still having fleeting chest pain.   Prior echocardiogram in 2017 was unremarkable per his report.  Exercise treadmill test was unremarkable.  12/30/2017.  Since he is having recurrent episodes, we will go ahead and check a coronary CT scan with possible FFR analysis to ensure that he does not have any evidence of underlying coronary artery disease.  Will need metoprolol.  Most recent  creatinine 1.06 11/30/2017  Morbid obesity -Continue to encourage weight loss.  Decrease carbohydrates.  No changes  Essential hypertension -Continue with lisinopril 20 mg a day.  Overall doing very well with this.   Medication Adjustments/Labs and Tests Ordered: Current medicines are reviewed at length with the patient today.  Concerns regarding medicines are outlined above.  Orders Placed This Encounter  Procedures  . CT CORONARY MORPH W/CTA COR W/SCORE W/CA W/CM &/OR WO/CM  . CT CORONARY FRACTIONAL FLOW RESERVE DATA PREP  .  CT CORONARY FRACTIONAL FLOW RESERVE FLUID ANALYSIS  . Basic metabolic panel   Meds ordered this encounter  Medications  . metoprolol tartrate (LOPRESSOR) 100 MG tablet    Sig: Take 1 tablet (100 mg total) by mouth once for 1 dose. Take 1 tablet 2 hours before your Coronary CT scan    Dispense:  1 tablet    Refill:  0    Patient Instructions  Medication Instructions:  The current medical regimen is effective;  continue present plan and medications.  If you need a refill on your cardiac medications before your next appointment, please call your pharmacy.   Lab work: Please have blood work before your Coronary CT scan. (BMP) If you have labs (blood work) drawn today and your tests are completely normal, you will receive your results only by: Marland Kitchen MyChart Message (if you have MyChart) OR . A paper copy in the mail If you have any lab test that is abnormal or we need to change your treatment, we will call you to review the results.  Testing/Procedures: Your physician has requested that you have Coronary CT. Cardiac computed tomography (CT) is a painless test that uses an x-ray machine to take clear, detailed pictures of your heart. For further information please visit [email or web address] Please follow instruction sheet as given.  Follow-Up: Further follow up will be based on the results of the above testing.  Thank you for choosing [facility]!!         Signed, Donato Schultz, MD  05/02/2018 3:50 PM    [facility]

## 2018-05-25 ENCOUNTER — Ambulatory Visit (INDEPENDENT_AMBULATORY_CARE_PROVIDER_SITE_OTHER): Payer: PRIVATE HEALTH INSURANCE | Admitting: Family Medicine

## 2018-05-25 ENCOUNTER — Other Ambulatory Visit: Payer: Self-pay

## 2018-05-25 ENCOUNTER — Ambulatory Visit
Admission: RE | Admit: 2018-05-25 | Discharge: 2018-05-25 | Disposition: A | Discharge status: Home / Self Care | Payer: PRIVATE HEALTH INSURANCE | Source: Ambulatory Visit | Attending: Family Medicine | Admitting: Family Medicine

## 2018-05-25 ENCOUNTER — Encounter: Payer: Self-pay | Admitting: Family Medicine

## 2018-05-25 DIAGNOSIS — M545 Low back pain, unspecified: Secondary | ICD-10-CM

## 2018-05-25 NOTE — Patient Instructions (Signed)
This sounds like typical low back pain.  Some combination of degenerative disc disease, facet arthropathy and sacroiliitis.  I will call with the x ray results.  Regardless, the treatment does not change.   Remember posture.  Consider a standing desk with foot stool. Keep active and working on the weight loss. Start looking into bariatric surgery.  It is the only medical treatment that is proven to produce significant, lasting weight loss.

## 2018-05-26 ENCOUNTER — Encounter: Payer: Self-pay | Admitting: Family Medicine

## 2018-05-26 NOTE — Assessment & Plan Note (Signed)
Worsens low back pain along with a host of other medical problems.  We discussed importance of wt loss.

## 2018-05-26 NOTE — Progress Notes (Signed)
Established Patient Office Visit  Subjective:  Patient ID: Todd Davis, male    DOB: 20-Feb-1986  Age: 33 y.o. MRN: [id number]  CC:  Chief Complaint  Patient presents with  . Tailbone Pain    HPI Todd Davis presents for acute low back pain.  33 yo with morbid obesity has acute low back pain for two days.  Pain is midline and non radiating.  No numbness, weakness or change in bowel or bladder.  Worse with movements.  Has had a few back pain episodes.  No prior imaging.    Past Medical History:  Diagnosis Date  . Bilateral hand numbness 10/26/2016  . Cerumen impaction 10/26/2016  . Chest pain 12/01/2017  . Costochondritis 10/26/2016  . Demyelinating changes in brain (HCC) 10/26/2016  . Dysuria 12/01/2017  . GERD (gastroesophageal reflux disease)   . Hypertension    prehypertension  . Leg mass, left 10/26/2016  . Light headedness 07/26/2017  . Renal disorder    kidney stones    History reviewed. No pertinent surgical history.  Family History  Problem Relation Age of Onset  . Hypertension Father   . Hypertension Maternal Grandmother   . Heart murmur Maternal Grandfather   . Cerebral aneurysm Paternal Grandfather   . Depression Sister   . Asperger's syndrome Brother     Social History   Socioeconomic History  . Marital status: Single    Spouse name: Not on file  . Number of children: Not on file  . Years of education: Not on file  . Highest education level: Not on file  Occupational History  . Not on file  Social Needs  . Financial resource strain: Not on file  . Food insecurity:    Worry: Not on file    Inability: Not on file  . Transportation needs:    Medical: Not on file    Non-medical: Not on file  Tobacco Use  . Smoking status: Never Smoker  . Smokeless tobacco: Never Used  Substance and Sexual Activity  . Alcohol use: No  . Drug use: No  . Sexual activity: Not on file  Lifestyle  . Physical activity:    Days per week: Not on file    Minutes  per session: Not on file  . Stress: Not on file  Relationships  . Social connections:    Talks on phone: Not on file    Gets together: Not on file    Attends religious service: Not on file    Active member of club or organization: Not on file    Attends meetings of clubs or organizations: Not on file    Relationship status: Not on file  . Intimate partner violence:    Fear of current or ex partner: Not on file    Emotionally abused: Not on file    Physically abused: Not on file    Forced sexual activity: Not on file  Other Topics Concern  . Not on file  Social History Narrative  . Not on file    Outpatient Medications Prior to Visit  Medication Sig Dispense Refill  . aspirin 81 MG chewable tablet Chew 81 mg by mouth as needed.    Marland Kitchen lisinopril (PRINIVIL,ZESTRIL) 20 MG tablet TAKE (1) TABLET BY MOUTH ONCE DAILY. 90 tablet 1  . metoprolol tartrate (LOPRESSOR) 100 MG tablet Take 1 tablet (100 mg total) by mouth once for 1 dose. Take 1 tablet 2 hours before your Coronary CT scan 1 tablet 0  . OMEPRAZOLE  PO Take 40 mg by mouth every 3 (three) days.     No facility-administered medications prior to visit.     No Known Allergies  ROS Review of Systems    Objective:    Physical Exam  BP 138/84   Pulse 67   Temp 98.1 F (36.7 C) (Oral)   Ht 6\' 6"  (1.981 m)   Wt (!) 368 lb 12.8 oz (167.3 kg)   SpO2 97%   BMI 42.62 kg/m  Wt Readings from Last 3 Encounters:  05/25/18 (!) 368 lb 12.8 oz (167.3 kg)  05/02/18 (!) 361 lb (163.7 kg)  04/19/18 (!) 367 lb (166.5 kg)   Bilateral low back tenderness affecting paraspinous muscles in the L4-5 region.  Pain is worse in sacral area with both SI joints seemingly tender to deep palpation.  Normal strength and reflexes of both LE  Health Maintenance Due  Topic Date Due  . TETANUS/TDAP  10/26/2004    There are no preventive care reminders to display for this patient.  Lab Results  Component Value Date   TSH 1.380 05/24/2017    Lab Results  Component Value Date   WBC 7.7 04/19/2018   HGB 13.4 04/19/2018   HCT 40.4 04/19/2018   MCV 82 04/19/2018   PLT 309 04/19/2018   Lab Results  Component Value Date   NA 140 11/30/2017   K 4.1 11/30/2017   CO2 26 11/30/2017   GLUCOSE 95 11/30/2017   BUN 12 11/30/2017   CREATININE 1.06 11/30/2017   BILITOT 0.3 05/24/2017   ALKPHOS 92 05/24/2017   AST 24 05/24/2017   ALT 34 05/24/2017   PROT 7.9 05/24/2017   ALBUMIN 4.6 05/24/2017   CALCIUM 9.5 11/30/2017   ANIONGAP 8 04/04/2015   Lab Results  Component Value Date   CHOL 141 05/24/2017   Lab Results  Component Value Date   HDL 39 (L) 05/24/2017   Lab Results  Component Value Date   LDLCALC 76 05/24/2017   Lab Results  Component Value Date   TRIG 131 05/24/2017   Lab Results  Component Value Date   CHOLHDL 3.6 05/24/2017   No results found for: HGBA1C    Assessment & Plan:   Problem List Items Addressed This Visit    Low back pain without sciatica   Relevant Orders   DG Lumbar Spine Complete (Completed)      No orders of the defined types were placed in this encounter.   Follow-up: No follow-ups on file.    Todd Manners, MD

## 2018-05-26 NOTE — Assessment & Plan Note (Signed)
Clearly musculoskeletal.  Rx with OTC NSAIDs X rays reassuring no congenital problems.

## 2018-06-15 ENCOUNTER — Other Ambulatory Visit: Payer: Self-pay

## 2018-06-15 ENCOUNTER — Telehealth (HOSPITAL_COMMUNITY): Payer: Self-pay | Admitting: Emergency Medicine

## 2018-06-15 NOTE — Telephone Encounter (Signed)
Reaching out to patient to offer assistance regarding upcoming cardiac imaging study; pt verbalizes understanding of appt date/time, parking situation and where to check in, pre-test NPO status and medications ordered, and verified current allergies; name and call back number provided for further questions should they arise Rockwell Alexandria RN Navigator Cardiac Imaging [facility] [phone] office [phone] cell  Pt denies symptoms of COVID19, denies having contacts exposed to COVID19

## 2018-06-16 ENCOUNTER — Other Ambulatory Visit: Payer: Self-pay

## 2018-06-16 ENCOUNTER — Ambulatory Visit (HOSPITAL_COMMUNITY)
Admission: RE | Admit: 2018-06-16 | Discharge: 2018-06-16 | Disposition: A | Discharge status: Home / Self Care | Payer: PRIVATE HEALTH INSURANCE | Source: Ambulatory Visit | Attending: Cardiology | Admitting: Cardiology

## 2018-06-16 DIAGNOSIS — I10 Essential (primary) hypertension: Secondary | ICD-10-CM | POA: Diagnosis present

## 2018-06-16 DIAGNOSIS — Z006 Encounter for examination for normal comparison and control in clinical research program: Secondary | ICD-10-CM

## 2018-06-16 DIAGNOSIS — R0789 Other chest pain: Secondary | ICD-10-CM

## 2018-06-16 MED ORDER — NITROGLYCERIN 0.4 MG SL SUBL
0.8000 mg | SUBLINGUAL_TABLET | Freq: Once | SUBLINGUAL | Status: AC
  Administered 2018-06-16: 0.8 mg via SUBLINGUAL
  Filled 2018-06-16: qty 25

## 2018-06-16 MED ORDER — NITROGLYCERIN 0.4 MG SL SUBL
SUBLINGUAL_TABLET | SUBLINGUAL | Status: AC
  Filled 2018-06-16: qty 2

## 2018-06-16 MED ORDER — IOHEXOL 350 MG/ML SOLN
80.0000 mL | Freq: Once | INTRAVENOUS | Status: AC | PRN
  Administered 2018-06-16: 80 mL via INTRAVENOUS

## 2018-06-16 NOTE — Research (Signed)
Cadfem Informed Consent    Patient Name: Todd Davis   Subject met inclusion and exclusion criteria.  The informed consent form, study requirements and expectations were reviewed with the subject and questions and concerns were addressed prior to the signing of the consent form.  The subject verbalized understanding of the trail requirements.  The subject agreed to participate in the CADFEM trial and signed the informed consent.  The informed consent was obtained prior to performance of any protocol-specific procedures for the subject.  A copy of the signed informed consent was given to the subject and a copy was placed in the subject's medical record.   Neva Seat

## 2018-06-16 NOTE — Telephone Encounter (Signed)
Ok to fill.  Only needs 1 tablet for CT today

## 2018-06-16 NOTE — Progress Notes (Signed)
Pt ambulatory out of department with steady gait noted. PT denies any complaints.

## 2018-06-16 NOTE — Progress Notes (Signed)
CT complete. Patient denies any complaints. Offered patient snack and beverage. 

## 2018-06-19 ENCOUNTER — Telehealth: Payer: Self-pay | Admitting: Family Medicine

## 2018-06-19 NOTE — Telephone Encounter (Signed)
Called and had to leave message for patient to reschedule.  Left voicemail for patient to call back or to send a MyChart message if he has any concerns.  Otherwise he should schedule follow-up appointment in about a month.  If he does call back please ask him to reschedule in a month.  If there are specific questions or concerns he has I can call him tomorrow during his appointment time and we can go over them over the phone.

## 2018-06-20 ENCOUNTER — Ambulatory Visit: Payer: PRIVATE HEALTH INSURANCE | Admitting: Family Medicine

## 2018-06-20 ENCOUNTER — Other Ambulatory Visit: Payer: Self-pay

## 2018-06-20 MED ORDER — METOPROLOL TARTRATE 100 MG PO TABS: 100.0000 mg | ORAL_TABLET | Freq: Once | ORAL | 0 refills | Status: DC

## 2018-09-01 ENCOUNTER — Other Ambulatory Visit: Payer: Self-pay | Admitting: Family Medicine

## 2018-09-04 ENCOUNTER — Encounter: Payer: Self-pay | Admitting: Family Medicine

## 2018-09-05 MED ORDER — LISINOPRIL 20 MG PO TABS: ORAL_TABLET | ORAL | 1 refills | Status: DC

## 2018-12-07 ENCOUNTER — Other Ambulatory Visit: Payer: Self-pay | Admitting: Family Medicine

## 2019-06-04 ENCOUNTER — Ambulatory Visit: Payer: PRIVATE HEALTH INSURANCE | Attending: Internal Medicine

## 2019-06-04 DIAGNOSIS — Z23 Encounter for immunization: Secondary | ICD-10-CM | POA: Insufficient documentation

## 2019-06-04 NOTE — Progress Notes (Signed)
   Covid-19 Vaccination Clinic  Name:  Hillery Bhalla    MRN: [id number] DOB: March 28, 1986  06/04/2019  Mr. Popelka was observed post Covid-19 immunization for 15 minutes without incident. He was provided with Vaccine Information Sheet and instruction to access the V-Safe system.   Mr. Raudenbush was instructed to call 911 with any severe reactions post vaccine: Marland Kitchen Difficulty breathing  . Swelling of face and throat  . A fast heartbeat  . A bad rash all over body  . Dizziness and weakness   Immunizations Administered    Name Date Dose VIS Date Route   Pfizer COVID-19 Vaccine 06/04/2019  4:11 PM 0.3 mL 03/09/2019 Intramuscular   Manufacturer: [employer], [employer]   Lot: [id number]   NDC: [address]-7

## 2019-06-25 ENCOUNTER — Ambulatory Visit: Payer: PRIVATE HEALTH INSURANCE | Attending: Internal Medicine

## 2019-06-25 DIAGNOSIS — Z23 Encounter for immunization: Secondary | ICD-10-CM

## 2019-06-25 NOTE — Progress Notes (Signed)
   Covid-19 Vaccination Clinic  Name:  Todd Davis    MRN: [id number] DOB: Sep 09, 1985  06/25/2019  [name] was observed post Covid-19 immunization for 15 minutes without incident. He was provided with Vaccine Information Sheet and instruction to access the V-Safe system.   [name] was instructed to call 911 with any severe reactions post vaccine: Marland Kitchen Difficulty breathing  . Swelling of face and throat  . A fast heartbeat  . A bad rash all over body  . Dizziness and weakness   Immunizations Administered    Name Date Dose VIS Date Route   Pfizer COVID-19 Vaccine 06/25/2019  9:04 AM 0.3 mL 03/09/2019 Intramuscular   Manufacturer: [employer], [employer]   Lot: [id number]   NDC: [address]-4

## 2019-07-15 IMAGING — DX DG LUMBAR SPINE COMPLETE 4+V
5 series · 5 of 5 positions shown · non-contrast
Comparison: None.

CLINICAL DATA: Low back pain radiating down both legs for 1 week,
no known injury

EXAM:
LUMBAR SPINE - COMPLETE 4+ VIEW

[dg lumbar spine complete 4 +v (1 of 5)]
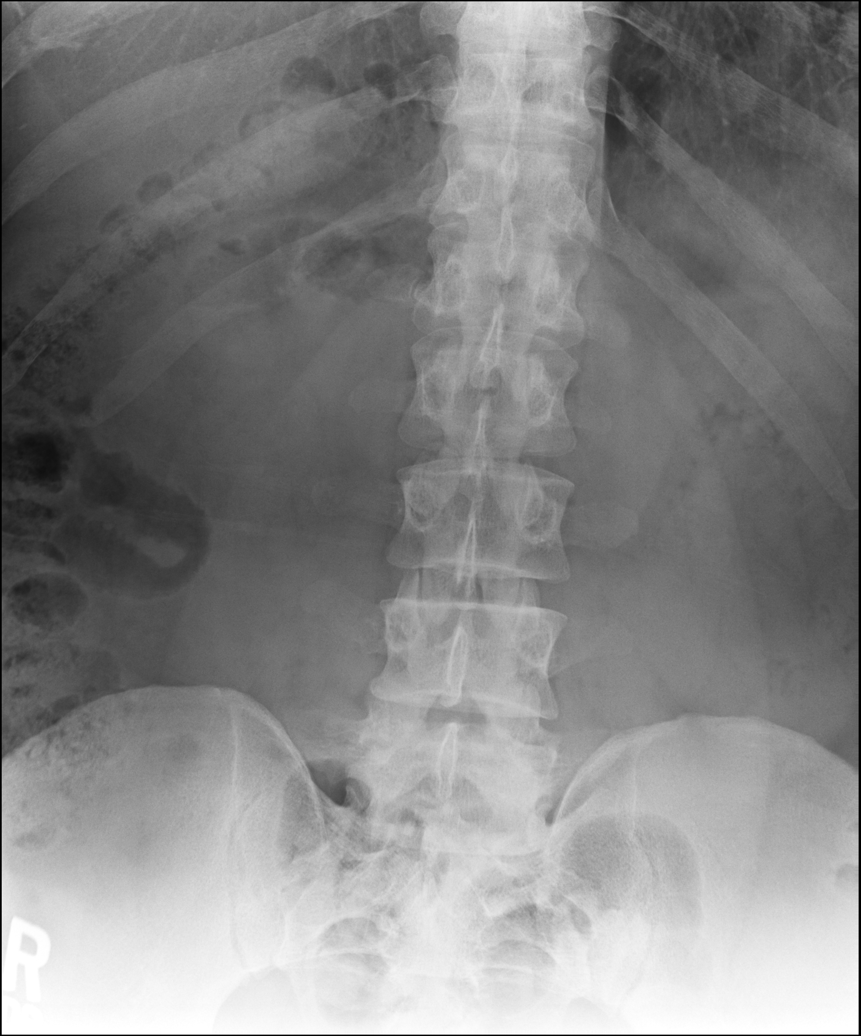

[dg lumbar spine complete 4 +v (2 of 5)]
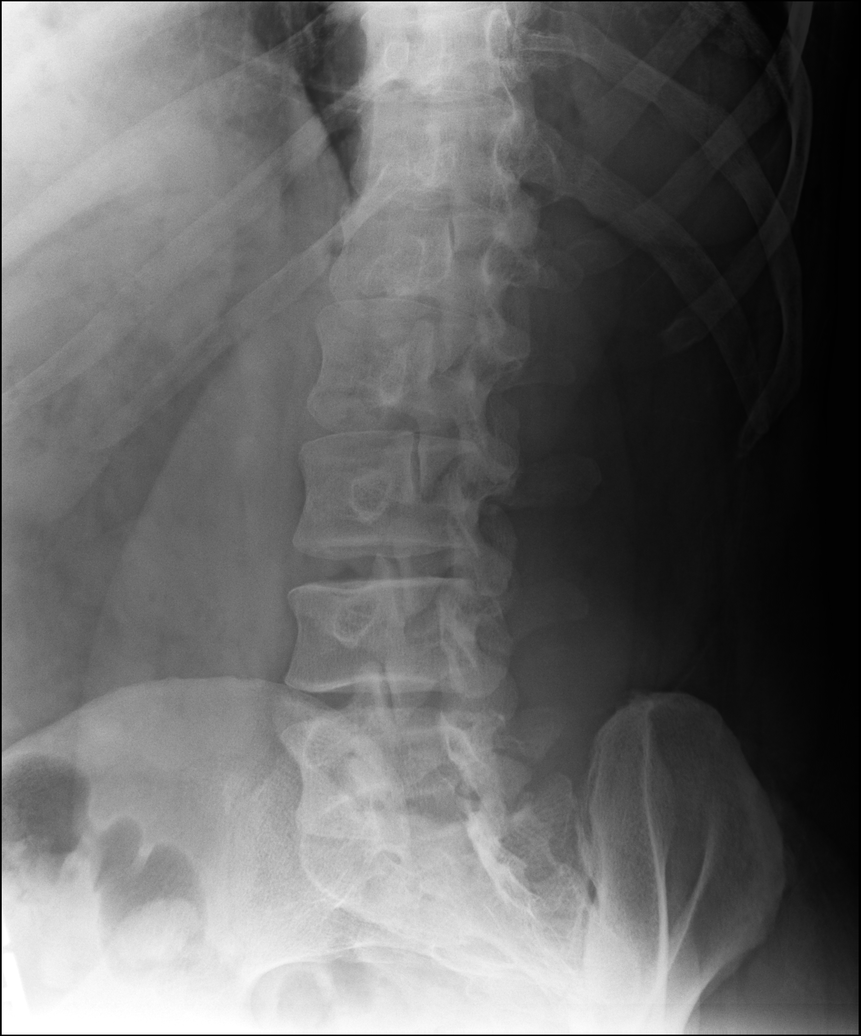

[dg lumbar spine complete 4 +v (3 of 5)]
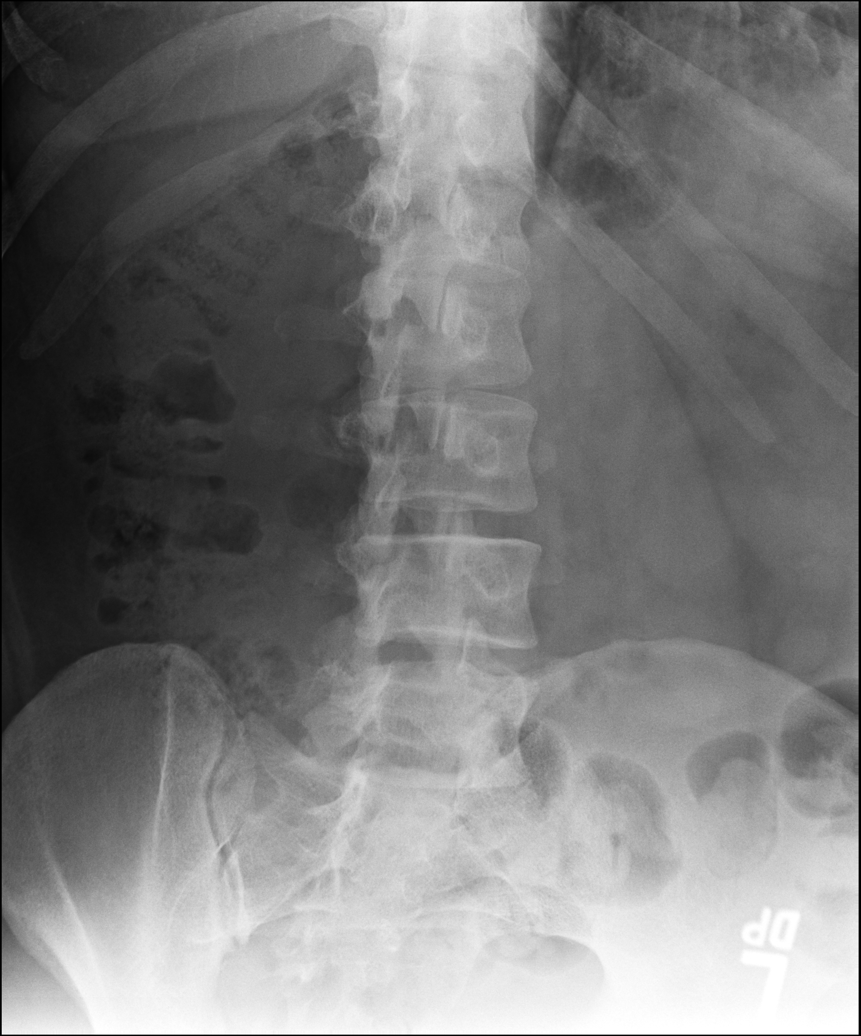

[dg lumbar spine complete 4 +v (4 of 5)]
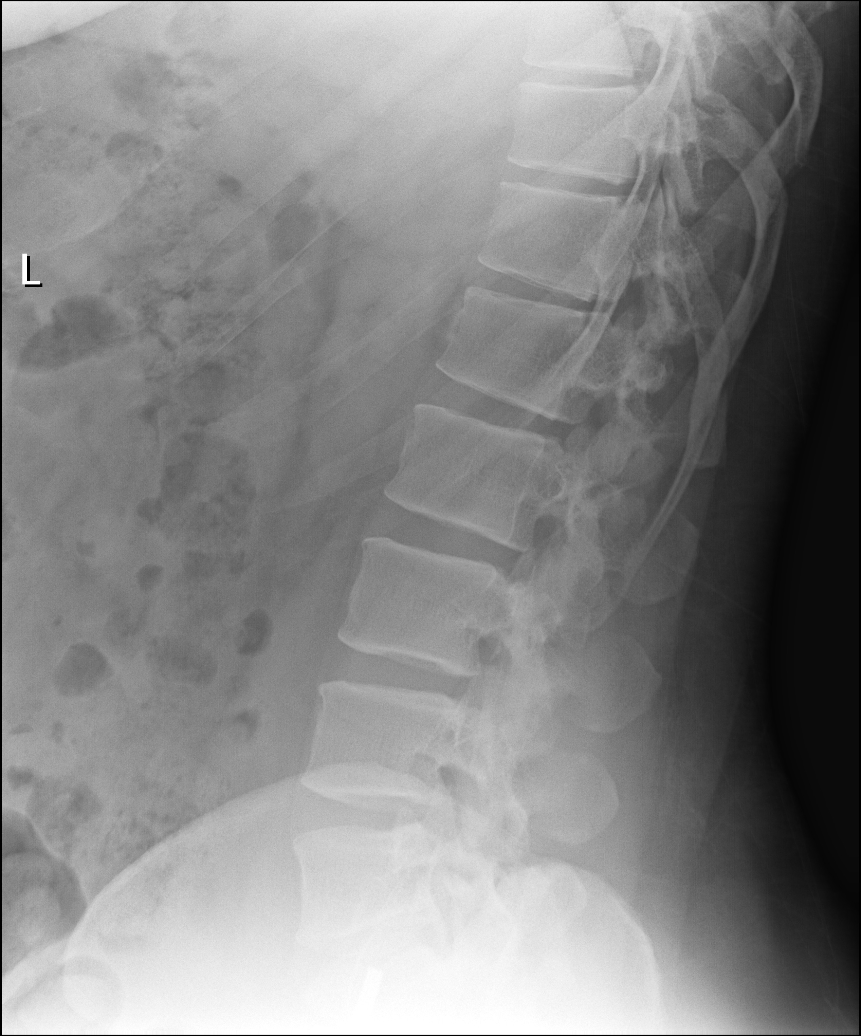

[dg lumbar spine complete 4 +v (5 of 5)]
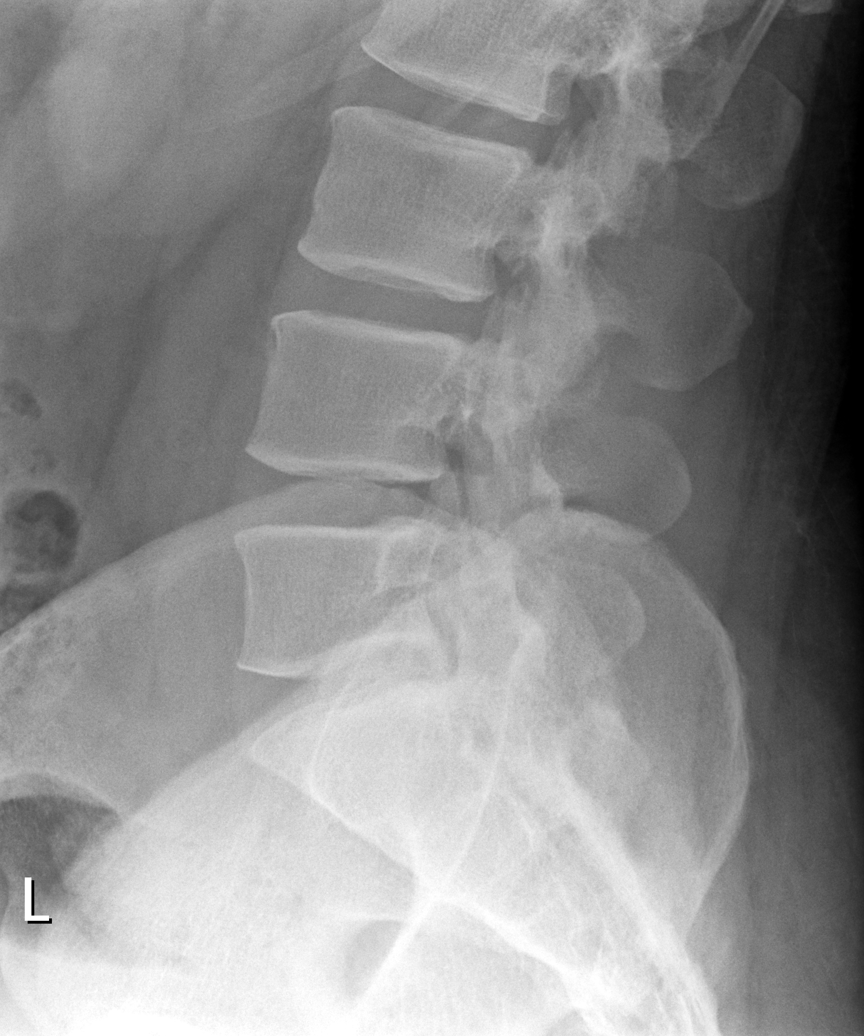

[5 of 5 positions shown; findings below may reference images not displayed]

FINDINGS: Alignment of the lumbar spine is normal. No acute or suspicious
osseous lesion. No fracture line or displaced fracture fragment
seen. Disc spaces are well maintained throughout. No evidence of
pars interarticularis defect. Visualized paravertebral soft tissues
are unremarkable
IMPRESSION: Negative.

## 2019-12-31 ENCOUNTER — Other Ambulatory Visit: Payer: Self-pay | Admitting: Family Medicine

## 2020-01-01 NOTE — Telephone Encounter (Signed)
Please call patient to schedule appt in next month, he has not been seen since 05/2018!

## 2020-01-04 NOTE — Progress Notes (Unsigned)
Established Patient Office Visit  Subjective:  Patient ID: Todd Davis, male    DOB: 06-Nov-1985  Age: 34 y.o. MRN: [id number]  CC: No chief complaint on file.   HPI Peace Jost presents for ***  Past Medical History:  Diagnosis Date  . Bilateral hand numbness 10/26/2016  . Cerumen impaction 10/26/2016  . Chest pain 12/01/2017  . Costochondritis 10/26/2016  . Demyelinating changes in brain (HCC) 10/26/2016  . Dysuria 12/01/2017  . GERD (gastroesophageal reflux disease)   . Hypertension    prehypertension  . Leg mass, left 10/26/2016  . Light headedness 07/26/2017  . Renal disorder    kidney stones    No past surgical history on file.  Family History  Problem Relation Age of Onset  . Hypertension Father   . Hypertension Maternal Grandmother   . Heart murmur Maternal Grandfather   . Cerebral aneurysm Paternal Grandfather   . Depression Sister   . Asperger's syndrome Brother     Social History   Socioeconomic History  . Marital status: Significant Other    Spouse name: Not on file  . Number of children: Not on file  . Years of education: Not on file  . Highest education level: Not on file  Occupational History  . Not on file  Tobacco Use  . Smoking status: Never Smoker  . Smokeless tobacco: Never Used  Vaping Use  . Vaping Use: Never used  Substance and Sexual Activity  . Alcohol use: No  . Drug use: No  . Sexual activity: Not on file  Other Topics Concern  . Not on file  Social History Narrative  . Not on file   Social Determinants of Health   Financial Resource Strain:   . Difficulty of Paying Living Expenses: Not on file  Food Insecurity:   . Worried About Programme researcher, broadcasting/film/video in the Last Year: Not on file  . Ran Out of Food in the Last Year: Not on file  Transportation Needs:   . Lack of Transportation (Medical): Not on file  . Lack of Transportation (Non-Medical): Not on file  Physical Activity:   . Days of Exercise per Week: Not on file    . Minutes of Exercise per Session: Not on file  Stress:   . Feeling of Stress : Not on file  Social Connections:   . Frequency of Communication with Friends and Family: Not on file  . Frequency of Social Gatherings with Friends and Family: Not on file  . Attends Religious Services: Not on file  . Active Member of Clubs or Organizations: Not on file  . Attends Banker Meetings: Not on file  . Marital Status: Not on file  Intimate Partner Violence:   . Fear of Current or Ex-Partner: Not on file  . Emotionally Abused: Not on file  . Physically Abused: Not on file  . Sexually Abused: Not on file    Outpatient Medications Prior to Visit  Medication Sig Dispense Refill  . aspirin 81 MG chewable tablet Chew 81 mg by mouth as needed.    Marland Kitchen lisinopril (ZESTRIL) 20 MG tablet TAKE 1 TABLET BY MOUTH EVERY Davis 30 tablet 0  . metoprolol tartrate (LOPRESSOR) 100 MG tablet Take 1 tablet (100 mg total) by mouth once for 1 dose. Take 1 tablet 2 hours before your Coronary CT scan 1 tablet 0  . OMEPRAZOLE PO Take 40 mg by mouth every 3 (three) days.     No facility-administered medications prior  to visit.    No Known Allergies  ROS Review of Systems    Objective:    Physical Exam  There were no vitals taken for this visit. Wt Readings from Last 3 Encounters:  05/25/18 (!) 368 lb 12.8 oz (167.3 kg)  05/02/18 (!) 361 lb (163.7 kg)  04/19/18 (!) 367 lb (166.5 kg)     Health Maintenance Due  Topic Date Due  . INFLUENZA VACCINE  10/28/2019    There are no preventive care reminders to display for this patient.  Lab Results  Component Value Date   TSH 1.380 05/24/2017   Lab Results  Component Value Date   WBC 7.7 04/19/2018   HGB 13.4 04/19/2018   HCT 40.4 04/19/2018   MCV 82 04/19/2018   PLT 309 04/19/2018   Lab Results  Component Value Date   NA 140 11/30/2017   K 4.1 11/30/2017   CO2 26 11/30/2017   GLUCOSE 95 11/30/2017   BUN 12 11/30/2017   CREATININE  1.06 11/30/2017   BILITOT 0.3 05/24/2017   ALKPHOS 92 05/24/2017   AST 24 05/24/2017   ALT 34 05/24/2017   PROT 7.9 05/24/2017   ALBUMIN 4.6 05/24/2017   CALCIUM 9.5 11/30/2017   ANIONGAP 8 04/04/2015   Lab Results  Component Value Date   CHOL 141 05/24/2017   Lab Results  Component Value Date   HDL 39 (L) 05/24/2017   Lab Results  Component Value Date   LDLCALC 76 05/24/2017   Lab Results  Component Value Date   TRIG 131 05/24/2017   Lab Results  Component Value Date   CHOLHDL 3.6 05/24/2017   No results found for: HGBA1C    Assessment & Plan:   Problem List Items Addressed This Visit    None      No orders of the defined types were placed in this encounter.   Follow-up: No follow-ups on file.    Sunday Spillers, CMA

## 2020-01-04 NOTE — Telephone Encounter (Signed)
Left message asking pt to call our office and schedule an appt as it has been over a year since we have seen him. Sunday Spillers, CMA

## 2020-01-11 NOTE — Telephone Encounter (Signed)
LVM to call office to assist in getting an appointment scheduled per Dr. Miquel Dunn.Jerron Niblack Zimmerman Rumple, CMA

## 2020-01-14 NOTE — Telephone Encounter (Signed)
Pt has appointment scheduled on 01/16/2020. Todd Davis, CMA

## 2020-01-15 NOTE — Progress Notes (Addendum)
SUBJECTIVE:   CHIEF COMPLAINT / HPI:   Hypertension-he notes he has a cuff at home and sometimes when he measures at home it is high.  He is just taking lisinopril 20 mg daily.  No vision changes or headaches.Not taking daily aspirin anymore.  Atypical chest pain -he notes that he has continued to have chest pain about 2-3 times a week over the summer.  He notes that it starts when he bends over and is always on his left side, and it shoots down his left arm, does not shoot up to his neck.  He notes it is sometimes worse with a deep breath.  He notes he has to sit in the chair when the pain comes on, and that it may take 2 to 10 minutes to go away.  It is not with exertion, it is usually just when bending over.  He does not have shoulder pain or pain moving around other times.  He has previously seen a cardiologist and had a normal stress test and a low risk coronary CT scan.  Low back pain-he notes that this has gotten worse over the past summer but has been present prior.  He notes he has not been as active this summer and has gained weight and that his back pain is better when he is walking more but hurts when he is not walking as much.  He notes that he will see it in his lower back or his upper mid back.  And this will come on if he walks for longer than 30 to 40 minutes.  No radiating pain, no bowel or bladder incontinence, no fevers, chills, swelling on his back, no leg weakness or numbness or falls.  Denies any previous surgery or injury to his back, but does note that he played football and had many collisions.  Left facial numbness-he notes about 3 weeks ago he had left-sided numbness that started in his face went down to his arm and lasted for about 10 minutes, happened when he was just sitting, no headache associated with it, no other vision changes, numbness or weakness.  He notes he has had this once before and went to see a neurologist but told him it was likely a complex migraine.  He  had an MRI x2 that I can review in the chart 1 from 08/2015 that noted concern for demyelinating disease, and a repeat in 07/2017 that said grossly unchanged small scattered white matter lesions which are nonspecific.  He has had previous work-up by his neurologist including Lyme titers, ultrasound carotid, and an echocardiogram, but I cannot review this imaging.  He had not had any episodes since he had seen her until this most recent episode.  Diarrhea and abdominal pain- severe abdominal pain that awoke him from sleep about two weeks ago with a bout of diarrhea, non-bloody, a few looser stools after that but no persistant, no further pain since. No abdominal pain since, no fevers, chills, nausea, vomiting, normal stools since.  PERTINENT  PMH / PSH:  Normal exercise stress test in 12/2017 Low risk coronary CT scan 05/2018- cardiology Dr Anne Fu, was given "clean bill of health" per patient report  OBJECTIVE:   BP 124/82   Pulse 88   Ht 6\' 6"  (1.981 m)   Wt (!) 376 lb 9.6 oz (170.8 kg)   SpO2 97%   BMI 43.52 kg/m   General: A&O, NAD HEENT: No sign of trauma, EOMI Cardiac: RRR, no m/r/g, chest wall  non-tender to palpation Respiratory: CTAB, normal WOB, no w/c/r GI: Soft, NTTP, non-distended  Extremities: NTTP, no peripheral edema. Neuro: Normal gait, moves all four extremities appropriately. Strength 5/5 in bilateral upper and lower extremities, strength intact b/l, patellar DTRs 2+ and symmetric. MSK: left arm with full ROM without pain, no pain to palpation of left shoulder. Negative SLR bilaterally, negative FADER bilaterally, T spine and L spine without bony step off or tenderness to palpation, no induration, no tenderness to palpation of SI joints or lumbar paraspinal musculature, no hip tenderness and full hip ROM appreciated Psych: Appropriate mood and affect   ASSESSMENT/PLAN:   Atypical chest pain - EKG read and interpreted by myself today: NSR rate 70 bpm, normal axis, normal PR  interval, no ST segment changes, T wave inversion in lead III and Q wave in lead III unchanged from prior ECG 03/2018 - not tender to palpation today, exacerbated by leaning down - will check cardiac risk profile labs today including lipid panel, A1c - consider referral back to cardiology for further recommendations going forward - return precautions discussed  HTN (hypertension) - well controlled today, recheck RFP, continue lisinopril  Left facial numbness -Previously seen by neurology and thought to be complex migraines.  Differential diagnosis also includes TIA versus demyelinating disorder (he does have a history of a MRI with abnormal demyelinating changes).  He does not have any focal neurologic abnormalities today and has previously had  negative work-up although I cannot view all imaging records. -We will request records from previous neurologist to review ultrasound carotids and echocardiogram -Getting labs including lipid panel and A1c for cardiovascular disease screening -Consider referral back to neurology due to history of abnormal MRI and recurrence of neurologic symptoms  Low back pain without sciatica - no red flag signs/symptoms, normal exam today - referral to PT for lumbar strengthening exercises - encouraged continued exercise as tolerated  Morbid obesity (HCC) - diet and exercise changes discussed today, he has made some good changes but feeling stuck as he has gained back weight this summer - referral to nutritionist for continued conversations, also discussed regular follow-up with myself for dietary counseling - screening labs for metabolic syndrome including A1c and lipid profile ordered today   Discussed episode of diarrhea and abdominal pain seemed isolated, no further work-up indicated for now and will continue to monitor.  Billey Co, MD [facility]

## 2020-01-16 ENCOUNTER — Encounter: Payer: Self-pay | Admitting: Family Medicine

## 2020-01-16 ENCOUNTER — Ambulatory Visit (INDEPENDENT_AMBULATORY_CARE_PROVIDER_SITE_OTHER): Payer: PRIVATE HEALTH INSURANCE | Admitting: Family Medicine

## 2020-01-16 ENCOUNTER — Other Ambulatory Visit: Payer: Self-pay

## 2020-01-16 ENCOUNTER — Ambulatory Visit (HOSPITAL_COMMUNITY)
Admission: RE | Admit: 2020-01-16 | Discharge: 2020-01-16 | Disposition: A | Discharge status: Home / Self Care | Payer: PRIVATE HEALTH INSURANCE | Source: Ambulatory Visit | Attending: Family Medicine | Admitting: Family Medicine

## 2020-01-16 VITALS — BP 124/82 | HR 88 | Ht 78.0 in | Wt 376.6 lb

## 2020-01-16 DIAGNOSIS — R0789 Other chest pain: Secondary | ICD-10-CM

## 2020-01-16 DIAGNOSIS — I10 Essential (primary) hypertension: Secondary | ICD-10-CM

## 2020-01-16 DIAGNOSIS — R200 Anesthesia of skin: Secondary | ICD-10-CM

## 2020-01-16 DIAGNOSIS — M545 Low back pain, unspecified: Secondary | ICD-10-CM

## 2020-01-16 DIAGNOSIS — R079 Chest pain, unspecified: Secondary | ICD-10-CM

## 2020-01-16 DIAGNOSIS — G8929 Other chronic pain: Secondary | ICD-10-CM

## 2020-01-16 LAB — POCT GLYCOSYLATED HEMOGLOBIN (HGB A1C)

## 2020-01-16 MED ORDER — OMEPRAZOLE 40 MG PO CPDR: 40.0000 mg | DELAYED_RELEASE_CAPSULE | Freq: Every day | ORAL | 1 refills | Status: DC

## 2020-01-16 NOTE — Assessment & Plan Note (Signed)
-  Previously seen by neurology and thought to be complex migraines.  Differential diagnosis also includes TIA versus demyelinating disorder (he does have a history of a MRI with abnormal demyelinating changes).  He does not have any focal neurologic abnormalities today and has previously had  negative work-up although I cannot view all imaging records. -We will request records from previous neurologist to review ultrasound carotids and echocardiogram -Getting labs including lipid panel and A1c for cardiovascular disease screening -Consider referral back to neurology due to history of abnormal MRI and recurrence of neurologic symptoms

## 2020-01-16 NOTE — Assessment & Plan Note (Signed)
-   well controlled today, recheck RFP, continue lisinopril

## 2020-01-16 NOTE — Assessment & Plan Note (Addendum)
-   diet and exercise changes discussed today, he has made some good changes but feeling stuck as he has gained back weight this summer - referral to nutritionist for continued conversations, also discussed regular follow-up with myself for dietary counseling - screening labs for metabolic syndrome including A1c and lipid profile ordered today

## 2020-01-16 NOTE — Assessment & Plan Note (Addendum)
-   EKG read and interpreted by myself today: NSR rate 70 bpm, normal axis, normal PR interval, no ST segment changes, T wave inversion in lead III and Q wave in lead III unchanged from prior ECG 03/2018 - not tender to palpation today, exacerbated by leaning down - will check cardiac risk profile labs today including lipid panel, A1c - consider referral back to cardiology for further recommendations going forward - return precautions discussed

## 2020-01-16 NOTE — Assessment & Plan Note (Signed)
-   no red flag signs/symptoms, normal exam today - referral to PT for lumbar strengthening exercises - encouraged continued exercise as tolerated

## 2020-01-16 NOTE — Patient Instructions (Addendum)
It was wonderful to see you today.  Please bring ALL of your medications with you to every visit.   Today we talked about:  - ECG normal, checking labs for cardiac risk stratification - Requesting records from your neurologist - referral to physical therapy for low back pain - referral to nutrition for weight loss   F/u in 2-3 months or sooner as needed   Thank you for choosing [facility].   Please call [phone] with any questions about today's appointment.  Please be sure to schedule follow up at the front  desk before you leave today.   Burley Saver, MD  Family Medicine

## 2020-01-17 ENCOUNTER — Telehealth: Payer: Self-pay | Admitting: Family Medicine

## 2020-01-17 DIAGNOSIS — E119 Type 2 diabetes mellitus without complications: Secondary | ICD-10-CM

## 2020-01-17 LAB — RENAL FUNCTION PANEL
Albumin: 4.4 g/dL (ref 4.0–5.0)
BUN/Creatinine Ratio: 12 (ref 9–20)
BUN: 12 mg/dL (ref 6–20)
CO2: 26 mmol/L (ref 20–29)
Calcium: 9.4 mg/dL (ref 8.7–10.2)
Chloride: 104 mmol/L (ref 96–106)
Creatinine, Ser: 0.99 mg/dL (ref 0.76–1.27)
GFR calc Af Amer: 114 mL/min/{1.73_m2} (ref >59)
GFR calc non Af Amer: 99 mL/min/{1.73_m2} (ref >59)
Glucose: 81 mg/dL (ref 65–99)
Phosphorus: 3.5 mg/dL (ref 2.8–4.1)
Potassium: 4.2 mmol/L (ref 3.5–5.2)
Sodium: 142 mmol/L (ref 134–144)

## 2020-01-17 LAB — LIPID PANEL
Chol/HDL Ratio: 3.3 ratio (ref 0.0–5.0)
Cholesterol, Total: 141 mg/dL (ref 100–199)
HDL: 43 mg/dL (ref >39)
LDL Chol Calc (NIH): 81 mg/dL (ref 0–99)
Triglycerides: 89 mg/dL (ref 0–149)
VLDL Cholesterol Cal: 17 mg/dL (ref 5–40)

## 2020-01-17 MED ORDER — BLOOD GLUCOSE MONITOR KIT: PACK | 0 refills | Status: DC

## 2020-01-17 MED ORDER — METFORMIN HCL ER 500 MG PO TB24: 500.0000 mg | ORAL_TABLET | Freq: Two times a day (BID) | ORAL | 3 refills | Status: DC

## 2020-01-17 NOTE — Telephone Encounter (Signed)
Called Mr Defelice and discussed Hgb A1c consistent with T2DM, this is a new diagnosis for him. Discussed dietary changes would be necessary but also high enough to recommend starting medications. Discussed metformin, will send 500mg  tab to pharmacy to taper up to goal of 1000 mg BID. Also discussed due to Hgb A1c 13.6 would recommend starting additional medication.  Discussed Victoza, he denies personal history of pancreatitis or thyroid cancer, but will confirm with parents. Discussed that this medication is a daily injectable, can help with weight loss and also diabetes sugar control. Discussed Jardiance as another option, daily oral medication, discussed risks of increased frequency of urination, UTIs, yeast infections.  Also discussed RFP which was normal, and cholesterol labs that were not elevated but would recommend starting atorvastatin due to diabetes diagnosis.  Answered questions and concerns and follow-up appointment scheduled 01/21/20 with me. Have sent metformin and a glucometer and blood glucose strips/lancets to pharmacy.   01/23/20 MD

## 2020-01-21 ENCOUNTER — Ambulatory Visit (INDEPENDENT_AMBULATORY_CARE_PROVIDER_SITE_OTHER): Payer: PRIVATE HEALTH INSURANCE | Admitting: Family Medicine

## 2020-01-21 ENCOUNTER — Encounter: Payer: Self-pay | Admitting: Family Medicine

## 2020-01-21 ENCOUNTER — Other Ambulatory Visit: Payer: Self-pay

## 2020-01-21 DIAGNOSIS — L608 Other nail disorders: Secondary | ICD-10-CM | POA: Insufficient documentation

## 2020-01-21 DIAGNOSIS — I10 Essential (primary) hypertension: Secondary | ICD-10-CM | POA: Diagnosis not present

## 2020-01-21 LAB — POCT GLYCOSYLATED HEMOGLOBIN (HGB A1C): Hemoglobin A1C: 5.6 % (ref 4.0–5.6)

## 2020-01-21 MED ORDER — LISINOPRIL 20 MG PO TABS: 20.0000 mg | ORAL_TABLET | Freq: Every day | ORAL | 3 refills | Status: DC

## 2020-01-21 NOTE — Assessment & Plan Note (Addendum)
-   reviewed recent lipid panel and repeat A1c 5.6 today - discussed dietary changes, has already cut out many starches, weight loss since last appt - referral to nutrition made last appointment - discussed lab error made with previous A1c

## 2020-01-21 NOTE — Patient Instructions (Addendum)
It was wonderful to see you today.  Please bring ALL of your medications with you to every visit.   Today we talked about:  YOU DO NOT HAVE DIABETES!!! Hemoglobin A1c was 5.6 today- not yet in the prediabetes range which was 5.7. Referrals sent out for nutrition and physical therapy from previous visit - I refilled your lisinopril in a 90-day supply  Thank you for choosing [facility].   Please call [phone] with any questions about today's appointment.  Please be sure to schedule follow up at the front  desk before you leave today.   Burley Saver, MD  Family Medicine

## 2020-01-21 NOTE — Assessment & Plan Note (Addendum)
-   right large great toenail detached from nailbed, small area open without current drainage but serous drainage noted on sock, crusting noted at edges of nail - no surrounding erythema or signs of abscess or infection at this time- discussed likely will lose toenail as it already appears detached from the bed, no signs of felon - discussed warm clean water soaks may aid in detachment of toenail - discussed if increasing redness or purulent drainage to call and could treat with course of topical antibiotics, at this time not indicated, also discussed could come in for office appointment to remove toenail if pain worsening and not falling off on its own

## 2020-01-21 NOTE — Progress Notes (Addendum)
    SUBJECTIVE:   CHIEF COMPLAINT / HPI:   Mr Lynam is a very pleasant 34 yo male who presents for follow-up after recent A1c 13.6 in our office. Repeat A1c POCT was 5.6 today. Discussed that previous A1c was lab error, and that he does not have a diagnosis of diabetes. I have cancelled all orders for metformin and blood glucose supplies, he had not yet picked them up.   He is requested a 90 day refill for his lisinopril 20mg .  Right big toenail- tripped three weeks ago, now nail is starting to detach, did have some fluid mixed with blood drained out yesterday, still having some pain when pushing on top of nail.   PERTINENT  PMH / PSH: as above  OBJECTIVE:   BP 118/86   Pulse 70   Ht 6\' 6"  (1.981 m)   Wt (!) 367 lb 12.8 oz (166.8 kg)   SpO2 95%   BMI 42.50 kg/m   General: A&O, NAD HEENT: No sign of trauma, EOM grossly intact Respiratory: normal WOB GI: non-distended  Extremities: no peripheral edema. Neuro: Normal gait, moves all four extremities appropriately Skin: no lesions/rashes visualized Psych: Appropriate mood and affect    ASSESSMENT/PLAN:   Morbid obesity (HCC) - reviewed recent lipid panel and repeat A1c 5.6 today - discussed dietary changes, has already cut out many starches, weight loss since last appt - referral to nutrition made last appointment - discussed lab error made with previous A1c  Toenail deformity - right large great toenail detached from nailbed, small area open without current drainage but serous drainage noted on sock, crusting noted at edges of nail - no surrounding erythema or signs of abscess or infection at this time- discussed likely will lose toenail as it already appears detached from the bed, no signs of felon - discussed warm clean water soaks may aid in detachment of toenail - discussed if increasing redness or purulent drainage to call and could treat with course of topical antibiotics, at this time not indicated, also  discussed could come in for office appointment to remove toenail if pain worsening and not falling off on its own     , MD [facility]

## 2020-05-11 ENCOUNTER — Other Ambulatory Visit: Payer: Self-pay | Admitting: Family Medicine

## 2020-05-11 DIAGNOSIS — I10 Essential (primary) hypertension: Secondary | ICD-10-CM

## 2020-08-15 ENCOUNTER — Other Ambulatory Visit: Payer: Self-pay

## 2020-08-15 ENCOUNTER — Ambulatory Visit (INDEPENDENT_AMBULATORY_CARE_PROVIDER_SITE_OTHER): Payer: PRIVATE HEALTH INSURANCE | Admitting: Family Medicine

## 2020-08-15 VITALS — BP 134/89 | HR 81 | Ht 78.0 in | Wt 363.2 lb

## 2020-08-15 DIAGNOSIS — R42 Dizziness and giddiness: Secondary | ICD-10-CM | POA: Diagnosis not present

## 2020-08-15 NOTE — Patient Instructions (Signed)
I am getting labs to further evaluate your episode of dizziness and rule ou to some causes of your dizziness. At this time I dont feel like it is vertigo. Possibly related to your blood pressure and stress level. Lets continue to monitor and follow up if it recurs, particularly if all labs are normal today. Be sure to stay well hydrated.

## 2020-08-15 NOTE — Assessment & Plan Note (Addendum)
Acute. Unclear etiology.  One episode of lightheadedness, gait instability, and dizziness upon getting up from bed that self resolved.  He did have elevated blood pressure during this event of 180/100.  Appears he has a history of intermittent episodes similar to this over the years ranging back to 2019. No clear cause identified. No history of trauma and no loss of consciousness. Prior MRI in 2019 that showed small nonspecific scattered white matter lesions that were stable from 2017. Has followed with neurology in past for this. Neuro exam unremarkable today including HINTs exam. Orthostatics negative. Does not appear consistent with vasovagal. No significant cardiac or respiratory symptoms during time of event. Normal exercise stress test in 2019 and normal EKG in 12/2019. Positive subjective Dix-Hallpike with mild dizziness elicited bilaterally.  Overall, does not strongly appear vestibular in nature at this time, however could consider referral to ENT for further evaluation if work up inconclusive and symptoms recur. No medications or substance use to explain cause. Hemodynamically stable with normal blood pressure today. He has been under significant stress with poor sleep recently per wife which may have contributed. He has a history of anxiety, particularly regarding his health. He also has a history of severe OSA with noncompliance to CPAP. I definitely feel this could be contributing as well.  Overall, I feel reassured that exam is normal today and symptoms have not recurred.  - Will start with basic lab work including CBC, BMP, TSH.  - Encouraged adequate hydration.  - Follow up if recurs for further evaluation. Would plan to obtain EKG at that time.  - Consider referral to therapy for anxiety. - can consider ENT referral for more thorough vestibular testing given mild positivity to Dix-Hall pike today

## 2020-08-15 NOTE — Progress Notes (Addendum)
Subjective:   Patient ID: Todd Davis    DOB: Aug 06, 1985, 35 y.o. male   MRN: [id number]  Todd Davis is a 35 y.o. male with a history of HTN, demyelinating changes in brain, atypical chest pain, dysuria, lightheadedness, low back pain without sciatica, morbid obesity  here for "dizziness when standing up"  HPI: Patient notes that he woke up to go to the bath room and when he stood up he felt extremely dizzy and off balance when walking to the bathroom. Checked blood pressure after returning to bed and BP was 180/101. He notes that he laid back down and then sat back up and noted that the room felt like it was spinning around. Has never had this happen before. Notes feeling of general lightheadedness ever since. No difficulty with ambulating. Denies falls or LOC. Alcohol occasional. No nicotine/tobacco. No illicit drugs. Endorses staying hydrated. Denies caffeine use.  Palpitations or heart racing: yes Prior dizziness: no  Taking blood thinners: no   Symptoms Hearing Loss: no  Ear Pain or fullness: no Nausea or vomiting: nausea during event Vision difficulty or double vision: no Falls: no Head trauma: no Weakness in arm or leg: no Speaking problems: no Headache: no  Meds: lisinopril, omeprazole, ibuprofen PRN  Review of Systems:  Per HPI.   Objective:   BP 134/89   Pulse 81   Ht 6\' 6"  (1.981 m)   Wt (!) 363 lb 3.2 oz (164.7 kg)   SpO2 96%   BMI 41.97 kg/m  Vitals and nursing note reviewed.  General: pleasant larger middle aged man, sitting comfortably in exam chair, well nourished, well developed, in no acute distress with non-toxic appearance HEENT: normocephalic, atraumatic, moist mucous membranes, oropharynx clear without erythema or exudate, PERRL Neck: supple, non-tender without lymphadenopathy CV: regular rate and rhythm without murmurs, rubs, or gallops Lungs: clear to auscultation bilaterally with normal work of breathing on room air, speaking in full  sentences Skin: warm, dry, no rashes or lesions Extremities: warm and well perfused, normal tone normal Neuro: Alert and oriented, speech normal, CN grossely intact, Negative HINTs exam, very mild dizziness with dix-hallpike bilaterally  Orthostatic VS for the past 24 hrs (Last 3 readings):  BP- Lying Pulse- Lying BP- Sitting Pulse- Sitting BP- Standing at 0 minutes Pulse- Standing at 0 minutes  08/15/20 0904 136/83 74 134/89 81 (!) 141/95 86    Assessment & Plan:   Dizziness Acute. Unclear etiology.  One episode of lightheadedness, gait instability, and dizziness upon getting up from bed that self resolved.  He did have elevated blood pressure during this event of 180/100.  Appears he has a history of intermittent episodes similar to this over the years ranging back to 2019. No clear cause identified. No history of trauma and no loss of consciousness. Prior MRI in 2019 that showed small nonspecific scattered white matter lesions that were stable from 2017. Has followed with neurology in past for this. Neuro exam unremarkable today including HINTs exam. Orthostatics negative. Does not appear consistent with vasovagal. No significant cardiac or respiratory symptoms during time of event. Normal exercise stress test in 2019 and normal EKG in 12/2019. Positive subjective Dix-Hallpike with mild dizziness elicited bilaterally.  Overall, does not strongly appear vestibular in nature at this time, however could consider referral to ENT for further evaluation if work up inconclusive and symptoms recur. No medications or substance use to explain cause. Hemodynamically stable with normal blood pressure today. He has been under significant stress with  poor sleep recently per wife which may have contributed. He has a history of anxiety, particularly regarding his health. He also has a history of severe OSA with noncompliance to CPAP. I definitely feel this could be contributing as well.  Overall, I feel  reassured that exam is normal today and symptoms have not recurred.  - Will start with basic lab work including CBC, BMP, TSH.  - Encouraged adequate hydration.  - Follow up if recurs for further evaluation. Would plan to obtain EKG at that time.  - Consider referral to therapy for anxiety. - can consider ENT referral for more thorough vestibular testing given mild positivity to Dix-Hall pike today   Orders Placed This Encounter  Procedures  . CBC  . Basic Metabolic Panel  . TSH   No orders of the defined types were placed in this encounter.    Orpah Cobb, DO PGY-3, [facility] 08/15/2020 6:31 PM

## 2020-08-16 LAB — CBC
Hematocrit: 44.2 % (ref 37.5–51.0)
Hemoglobin: 14.7 g/dL (ref 13.0–17.7)
MCH: 27.7 pg (ref 26.6–33.0)
MCHC: 33.3 g/dL (ref 31.5–35.7)
MCV: 83 fL (ref 79–97)
Platelets: 300 10*3/uL (ref 150–450)
RBC: 5.3 x10E6/uL (ref 4.14–5.80)
RDW: 12.9 % (ref 11.6–15.4)
WBC: 7.8 10*3/uL (ref 3.4–10.8)

## 2020-08-16 LAB — BASIC METABOLIC PANEL
BUN/Creatinine Ratio: 15 (ref 9–20)
BUN: 16 mg/dL (ref 6–20)
CO2: 21 mmol/L (ref 20–29)
Calcium: 9.5 mg/dL (ref 8.7–10.2)
Chloride: 102 mmol/L (ref 96–106)
Creatinine, Ser: 1.04 mg/dL (ref 0.76–1.27)
Glucose: 96 mg/dL (ref 65–99)
Potassium: 4.2 mmol/L (ref 3.5–5.2)
Sodium: 139 mmol/L (ref 134–144)
eGFR: 97 mL/min/{1.73_m2} (ref >59)

## 2020-08-16 LAB — TSH: TSH: 1.54 u[IU]/mL (ref 0.450–4.500)

## 2020-08-20 ENCOUNTER — Encounter: Payer: Self-pay | Admitting: Family Medicine

## 2020-08-21 ENCOUNTER — Ambulatory Visit (HOSPITAL_COMMUNITY)
Admission: RE | Admit: 2020-08-21 | Discharge: 2020-08-21 | Disposition: A | Discharge status: Home / Self Care | Payer: PRIVATE HEALTH INSURANCE | Source: Ambulatory Visit | Attending: Family Medicine | Admitting: Family Medicine

## 2020-08-21 ENCOUNTER — Ambulatory Visit (INDEPENDENT_AMBULATORY_CARE_PROVIDER_SITE_OTHER): Payer: PRIVATE HEALTH INSURANCE | Admitting: Family Medicine

## 2020-08-21 ENCOUNTER — Encounter: Payer: Self-pay | Admitting: Family Medicine

## 2020-08-21 ENCOUNTER — Other Ambulatory Visit: Payer: Self-pay

## 2020-08-21 VITALS — BP 122/82 | HR 86 | Ht 78.0 in | Wt 359.8 lb

## 2020-08-21 DIAGNOSIS — R0789 Other chest pain: Secondary | ICD-10-CM

## 2020-08-21 DIAGNOSIS — R42 Dizziness and giddiness: Secondary | ICD-10-CM

## 2020-08-21 NOTE — Assessment & Plan Note (Signed)
Performed thorough neuro exam with patient that was unremarkable.  Labs reviewed with normal BMP, CBC, TSH on 08/15/2020.  Patient with history of neurologic symptoms without any clear etiology.  He has been seen with neurology previously who noted less concern for demyelinating disease given his history and lack of family history.  I am not concerned for stroke or TIA given his physical exam and history.  Can consider atypical migraine versus anxiety given previously abnormal MRI. Given history of untreated OSA, can consider IIH; however, his headaches are not consistent with typical presentation and no visual symptoms. I have recommended that patient start using his CPAP. Also, given that the pressure has been happening for a few days, I have suggested that he continue to monitor with ED precautions provided.

## 2020-08-21 NOTE — Assessment & Plan Note (Signed)
Patient had normal exercise tolerance test on 12/30/2017.  He had a normal coronary CT on 06/16/2018.  His most recent lipid panel was normal and he has well-controlled hypertension.   His EKG today is unchanged from previous EKGs.  Patient has had no pulmonary symptoms suggesting pulmonary etiology.  No new exercises or tenderness to palpation suggesting MSK etiology.  He has had multiple presentations for chest pain in the past (dating back to 2016 per chart review) and there may be some component of anxiety involved, though cannot completely rule out a cardiac process given his risk factors. I recommend that he follow-up with Dr. Anne Fu for further evaluation.

## 2020-08-21 NOTE — Progress Notes (Deleted)
    SUBJECTIVE:   CHIEF COMPLAINT / HPI:   ***  PERTINENT  PMH / PSH: ***  OBJECTIVE:   There were no vitals taken for this visit.  ***  ASSESSMENT/PLAN:   No problem-specific Assessment & Plan notes found for this encounter.   Labs reviewed with normal BMP, CBC, TSH on 08/15/2020.  Normal lipid panel in October 2021.  Melene Plan, MD [facility]   {    This will disappear when note is signed, click to select method of visit    :1}

## 2020-08-21 NOTE — Telephone Encounter (Signed)
Patient returns call to nurse line to discuss symptoms. Patient reports intermittent chest discomfort. Patient is not currently experiencing chest pain or shortness of breath.   Recommended that patient be evaluated. Scheduled patient in ATC clinic this afternoon at 1330. Strict ED precautions given in the meantime.   Veronda Prude, RN

## 2020-08-21 NOTE — Progress Notes (Signed)
SUBJECTIVE:   CHIEF COMPLAINT / HPI:   Patient presenting today with pressure in his head/dizziness as well as chest pressure. Patient was seen last week with Dr. Mauri Reading with dizziness when he woke up at night to go to the bathroom.  Patient reports that he is feeling this intense pressure behind his ears and from the nape of his neck to the top of his head.  He reports he gets a sensation that his ears want to pop up.  He denies any particular pattern.  It is associated with the lightheadedness that he was experiencing last week.  Denies any disorientation.  He reports that it goes away on its own and the episodes last for about a minute.  The pressure started last night and has happened about 4 times today. Patient reports that this is followed by chest pressure.  He was seen on 01/16/2020 for atypical chest pain and reports that this is different from that episode.  This chest pressure is located in the area under his clavicles and feels more like pressure than pain.  He reports it feels like something is sitting on him.  Patient reports that this also started last week but became more intense today.  He reports he was sitting on the couch last night not doing anything and it faded after 15 minutes.  He went about his typical evening routine and went to bed.  He reports it restarted this morning after getting ready for the day.  The chest pressure has been happening in waves.  Episodes last for about 2 to 3 minutes and fades on its own.  This happened about 5 times today.  He reports he has not had an episode of chest pressure as intense as last night.  He has never taken nitro. Patient does report that he has a lot of anxiety surrounding his medical issues.  Looking back at patient's history, appears he had an MRI showing demyelinating changes in the brain.  He was followed by neurology who suggested that he go for sleep study.  His sleep study was significant for OSA.  Patient reports that he  fell asleep for about 5 minutes and was then told that he had OSA.  He was skeptical of this diagnosis given the amount of time that he was asleep for.  He reports that he tried his CPAP and has tried different attachments for the CPAP but continues to have the feeling of claustrophobia.  He has not used his CPAP since.  He does report that he has been trying to lose weight recently with a new diet.  No family hx of MS. Family hx s/f heart disease.   PERTINENT  PMH / PSH: Obesity, OSA  OBJECTIVE:   BP 122/82   Pulse 86   Ht 6\' 6"  (1.981 m)   Wt (!) 359 lb 12.8 oz (163.2 kg)   SpO2 96%   BMI 41.58 kg/m    Gen: NAD, alert, non-toxic, obese HEENT: Normocephaic, atraumatic. PERRLA, clear conjuctiva, no scleral icterus and injection. Normal EOM. Neck supple with no LAD, nodules, or gross abnormality. Nares patent with no discharge.  Oropharynx without erythema and lesions.  Tonsils nonswollen and without exudate.  CV: Regular rate and rhythm.  Normal S1-S2.  No murmur, gallops. No bilateral lower extremity edema. No TTP of anterior chest wall.  Resp: Clear to auscultation bilaterally.  No wheezing, rales, rhonchi, or other abnormal lung sounds.  No increased work of breathing appreciated. Abd: Nontender  and nondistended on palpation to all 4 quadrants.  Positive bowel sounds. Skin: No obvious rashes, lesions, or trauma.  Normal turgor.  MSK: Normal ROM. Normal strength and tone.  Psych: Cooperative with exam.  Normal speech. Pleasant. Makes good eye contact. Neuro: Cranial nerves II through XII grossly ntact.  There are no visual field deficits.  Strength is 5 out of 5 in all major muscle groups bilaterally in upper and lower extremities.  Patient has normal coordination with rapid alternating movements and fine finger movements.  There is no dysmetria on finger-to-nose or heel-to-shin.  No abnormal extraneous movements and Romberg is absent.  Patient has normal posture and gait is  steady.  ASSESSMENT/PLAN:   Dizziness Performed thorough neuro exam with patient that was unremarkable.  Labs reviewed with normal BMP, CBC, TSH on 08/15/2020.  Patient with history of neurologic symptoms without any clear etiology.  He has been seen with neurology previously who noted less concern for demyelinating disease given his history and lack of family history.  I am not concerned for stroke or TIA given his physical exam and history.  Can consider atypical migraine versus anxiety given previously abnormal MRI. Given history of untreated OSA, can consider IIH; however, his headaches are not consistent with typical presentation and no visual symptoms. I have recommended that patient start using his CPAP. Also, given that the pressure has been happening for a few days, I have suggested that he continue to monitor with ED precautions provided.   Atypical chest pain Patient had normal exercise tolerance test on 12/30/2017.  He had a normal coronary CT on 06/16/2018.  His most recent lipid panel was normal and he has well-controlled hypertension.   His EKG today is unchanged from previous EKGs.  Patient has had no pulmonary symptoms suggesting pulmonary etiology.  No new exercises or tenderness to palpation suggesting MSK etiology.  He has had multiple presentations for chest pain in the past (dating back to 2016 per chart review) and there may be some component of anxiety involved, though cannot completely rule out a cardiac process given his risk factors. I recommend that he follow-up with Dr. Anne Fu for further evaluation.    Melene Plan, MD [facility]

## 2021-02-18 ENCOUNTER — Other Ambulatory Visit: Payer: Self-pay | Admitting: Family Medicine

## 2021-02-18 DIAGNOSIS — R079 Chest pain, unspecified: Secondary | ICD-10-CM

## 2021-05-14 NOTE — Progress Notes (Signed)
° ° °  SUBJECTIVE:   CHIEF COMPLAINT / HPI:   Physical Concerns today: Low back pain-sometimes radiates down both his legs.  Has just been the past 3 days.  Has been trying ibuprofen for it which has helped.  No bowel or bladder incontinence, saddle paresthesias, fevers or chills.  PMH:  HTN- on lisinopril 20mg . Has not taken medications yet today. No CKD. Denies headaches, dizziness, vision changes, chest pain.  Has lost 20lbs. changed diet only eating complex carbs.  Eats plenty of protein.  Has been walking regularly.  Wants to track more regularly as he feels like he has reached a weight loss plateau.  Would like to recheck his cholesterol today.  Social, surgical, family history reviewed.   Vaccinations: Due for COVID bivalent booster, Flu vaccine   PERTINENT  PMH / PSH: HTN, atypical chest pain, obesity  OBJECTIVE:   BP (!) 141/102    Pulse 60    Ht 6\' 6"  (1.981 m)    Wt (!) 356 lb 9.6 oz (161.8 kg)    SpO2 98%    BMI 41.21 kg/m   General: A&O, NAD HEENT: No sign of trauma, EOM grossly intact Cardiac: RRR, no m/r/g Respiratory: CTAB, normal WOB, no w/c/r GI: Soft, NTTP, non-distended Spine-lumbar spine and lumbar paraspinal muscles nontender to palpation, no induration or erythema or rash overlying back, strength 5 out of 5 in bilateral lower extremities, patellar DTRs 2+ bilaterally Extremities: NTTP, no peripheral edema. Neuro: Normal gait, moves all four extremities appropriately. Psych: Appropriate mood and affect   ASSESSMENT/PLAN:   HTN (hypertension) - Blood pressure not at goal, even on repeat, patient without symptoms, has not taken his lisinopril yet today, refill sent to pharmacy, new blood pressure cuff sent to pharmacy, to call if greater than 140/90 regularly  Morbid obesity (HCC) - Congratulated patient on weight loss, discussed healthy weight and wellness is referral option the future if he is interested, will check lipid panel today  Low back pain  without sciatica - Discussed conservative stretches and recommend Tylenol instead of ibuprofen as he is on lisinopril, consider PT referral if not improving, discussed red flag signs and symptoms   Flu vaccine given today COVID bivalent booster offered and declined.  , MD [facility]

## 2021-05-14 NOTE — Patient Instructions (Addendum)
It was wonderful to see you today.  Please bring ALL of your medications with you to every visit.   Today we talked about:  - Your blood pressure continue your lisinopril - Given flu vaccine - Checked your kidney function so I can send refills for a year   Thank you for choosing [facility].   Please call [phone] with any questions about today's appointment.  Please be sure to schedule follow up at the front  desk before you leave today.   Please arrive at least 15 minutes prior to your scheduled appointments.   If you had blood work today, I will send you a MyChart message or a letter if results are normal. Otherwise, I will give you a call.   If you had a referral placed, they will call you to set up an appointment. Please give [place] a call if you don't hear back in the next 2 weeks.   If you need additional refills before your next appointment, please call your pharmacy first.   Burley Saver, MD  Family Medicine

## 2021-05-15 ENCOUNTER — Other Ambulatory Visit: Payer: Self-pay

## 2021-05-15 ENCOUNTER — Ambulatory Visit (INDEPENDENT_AMBULATORY_CARE_PROVIDER_SITE_OTHER): Payer: No Typology Code available for payment source | Admitting: Family Medicine

## 2021-05-15 ENCOUNTER — Encounter: Payer: Self-pay | Admitting: Family Medicine

## 2021-05-15 ENCOUNTER — Encounter: Payer: No Typology Code available for payment source | Admitting: Family Medicine

## 2021-05-15 VITALS — BP 141/102 | HR 60 | Ht 78.0 in | Wt 356.6 lb

## 2021-05-15 DIAGNOSIS — M545 Low back pain, unspecified: Secondary | ICD-10-CM | POA: Diagnosis not present

## 2021-05-15 DIAGNOSIS — Z23 Encounter for immunization: Secondary | ICD-10-CM | POA: Diagnosis not present

## 2021-05-15 DIAGNOSIS — I10 Essential (primary) hypertension: Secondary | ICD-10-CM | POA: Diagnosis not present

## 2021-05-15 DIAGNOSIS — G8929 Other chronic pain: Secondary | ICD-10-CM

## 2021-05-15 MED ORDER — LISINOPRIL 20 MG PO TABS: 20.0000 mg | ORAL_TABLET | Freq: Every day | ORAL | 3 refills | Status: DC

## 2021-05-15 NOTE — Assessment & Plan Note (Signed)
-   Congratulated patient on weight loss, discussed healthy weight and wellness is referral option the future if he is interested, will check lipid panel today

## 2021-05-15 NOTE — Assessment & Plan Note (Signed)
-   Blood pressure not at goal, even on repeat, patient without symptoms, has not taken his lisinopril yet today, refill sent to pharmacy, new blood pressure cuff sent to pharmacy, to call if greater than 140/90 regularly

## 2021-05-15 NOTE — Assessment & Plan Note (Signed)
-   Discussed conservative stretches and recommend Tylenol instead of ibuprofen as he is on lisinopril, consider PT referral if not improving, discussed red flag signs and symptoms

## 2021-05-16 LAB — BASIC METABOLIC PANEL
BUN/Creatinine Ratio: 17 (ref 9–20)
BUN: 17 mg/dL (ref 6–20)
CO2: 25 mmol/L (ref 20–29)
Calcium: 9.5 mg/dL (ref 8.7–10.2)
Chloride: 105 mmol/L (ref 96–106)
Creatinine, Ser: 0.98 mg/dL (ref 0.76–1.27)
Glucose: 95 mg/dL (ref 70–99)
Potassium: 4.3 mmol/L (ref 3.5–5.2)
Sodium: 142 mmol/L (ref 134–144)
eGFR: 103 mL/min/{1.73_m2} (ref >59)

## 2021-05-16 LAB — LIPID PANEL
Chol/HDL Ratio: 3.5 ratio (ref 0.0–5.0)
Cholesterol, Total: 145 mg/dL (ref 100–199)
HDL: 42 mg/dL (ref >39)
LDL Chol Calc (NIH): 90 mg/dL (ref 0–99)
Triglycerides: 66 mg/dL (ref 0–149)
VLDL Cholesterol Cal: 13 mg/dL (ref 5–40)

## 2021-05-18 ENCOUNTER — Encounter: Payer: Self-pay | Admitting: Family Medicine

## 2021-09-01 ENCOUNTER — Encounter: Payer: Self-pay | Admitting: *Deleted

## 2022-07-12 ENCOUNTER — Other Ambulatory Visit: Payer: Self-pay | Admitting: Family Medicine

## 2022-07-12 DIAGNOSIS — I10 Essential (primary) hypertension: Secondary | ICD-10-CM

## 2022-08-16 ENCOUNTER — Other Ambulatory Visit: Payer: Self-pay | Admitting: Family Medicine

## 2022-08-16 DIAGNOSIS — I10 Essential (primary) hypertension: Secondary | ICD-10-CM

## 2022-08-16 MED ORDER — LISINOPRIL 20 MG PO TABS: 20.0000 mg | ORAL_TABLET | Freq: Every day | ORAL | 0 refills | Status: DC

## 2022-08-19 ENCOUNTER — Encounter: Payer: Self-pay | Admitting: Family Medicine

## 2022-08-19 ENCOUNTER — Ambulatory Visit (INDEPENDENT_AMBULATORY_CARE_PROVIDER_SITE_OTHER): Payer: No Typology Code available for payment source | Admitting: Family Medicine

## 2022-08-19 VITALS — BP 156/106 | HR 65 | Ht 78.0 in | Wt 368.0 lb

## 2022-08-19 DIAGNOSIS — R600 Localized edema: Secondary | ICD-10-CM

## 2022-08-19 DIAGNOSIS — Z23 Encounter for immunization: Secondary | ICD-10-CM

## 2022-08-19 DIAGNOSIS — R21 Rash and other nonspecific skin eruption: Secondary | ICD-10-CM | POA: Diagnosis not present

## 2022-08-19 DIAGNOSIS — I10 Essential (primary) hypertension: Secondary | ICD-10-CM | POA: Diagnosis not present

## 2022-08-19 LAB — CBC: HCT: 42.8 % (ref 38.5–50.0)

## 2022-08-19 MED ORDER — HYDROCORTISONE 2.5 % EX OINT: TOPICAL_OINTMENT | CUTANEOUS | 0 refills | Status: DC

## 2022-08-19 MED ORDER — HYDROCHLOROTHIAZIDE 12.5 MG PO TABS: 12.5000 mg | ORAL_TABLET | Freq: Every day | ORAL | 3 refills | Status: DC

## 2022-08-19 NOTE — Assessment & Plan Note (Signed)
-   likely related to gravity and extra fluid in body - hctz will help BP as well as decrease edema - recommend compression stockings as well as leg elevation - pt denies shortness of breath

## 2022-08-19 NOTE — Assessment & Plan Note (Signed)
-   on lisinopril 20mg  have added hctz 12.5mg  for better bp control as well as helping resolve leg edema

## 2022-08-19 NOTE — Assessment & Plan Note (Signed)
-   at next visit will discuss weight loss options - discussed lifestyle modifications - ordered A1c

## 2022-08-19 NOTE — Progress Notes (Signed)
New patient visit   Patient: Todd Davis   DOB: 01-23-1986   37 y.o. Male  MRN: [id number] Visit Date: 08/19/2022  Today's healthcare provider: Charlton Amor, DO   Chief Complaint  Patient presents with   Establish Care    SUBJECTIVE    Chief Complaint  Patient presents with   Establish Care   HPI  Pt presents to establish care.   Has a pmh of HTN - on lisinopril 20mg  daily  - BP uncontrolled today at 156/106 - UTD on eye exam  - denies chest pain, shortness of breath, blurry vision   Review of Systems  Constitutional:  Negative for activity change, fatigue and fever.  Respiratory:  Negative for cough and shortness of breath.   Cardiovascular:  Negative for chest pain.  Gastrointestinal:  Negative for abdominal pain.  Genitourinary:  Positive for genital sores. Negative for difficulty urinating.       Current Meds  Medication Sig   hydrochlorothiazide (HYDRODIURIL) 12.5 MG tablet Take 1 tablet (12.5 mg total) by mouth daily.   hydrocortisone 2.5 % ointment Apply to affected area BID.   lisinopril (ZESTRIL) 20 MG tablet Take 1 tablet (20 mg total) by mouth daily. Last refill- Please make appt with MD before further refills    OBJECTIVE    BP (!) 156/106   Pulse 65   Ht 6\' 6"  (1.981 m)   Wt (!) 368 lb (166.9 kg)   SpO2 99%   BMI 42.53 kg/m   Physical Exam Vitals and nursing note reviewed.  Constitutional:      General: He is not in acute distress.    Appearance: Normal appearance.  HENT:     Head: Normocephalic and atraumatic.     Right Ear: External ear normal.     Left Ear: External ear normal.     Nose: Nose normal.  Eyes:     Conjunctiva/sclera: Conjunctivae normal.  Cardiovascular:     Rate and Rhythm: Normal rate and regular rhythm.  Pulmonary:     Effort: Pulmonary effort is normal.     Breath sounds: Normal breath sounds.     Comments: 2+ pitting edema of left leg Neurological:     General: No focal deficit present.      Mental Status: He is alert and oriented to person, place, and time.  Psychiatric:        Mood and Affect: Mood normal.        Behavior: Behavior normal.        Thought Content: Thought content normal.        Judgment: Judgment normal.          ASSESSMENT & PLAN    Problem List Items Addressed This Visit       Cardiovascular and Mediastinum   HTN (hypertension) - Primary    - on lisinopril 20mg  have added hctz 12.5mg  for better bp control as well as helping resolve leg edema      Relevant Medications   hydrochlorothiazide (HYDRODIURIL) 12.5 MG tablet   Other Relevant Orders   COMPLETE METABOLIC PANEL WITH GFR   CBC   Microalbumin / creatinine urine ratio     Other   Class 3 severe obesity due to excess calories without serious comorbidity with body mass index (BMI) of 40.0 to 44.9 in adult [facility]    - at next visit will discuss weight loss options - discussed lifestyle modifications - ordered A1c  Relevant Orders   HgB A1c   Leg edema    - likely related to gravity and extra fluid in body - hctz will help BP as well as decrease edema - recommend compression stockings as well as leg elevation - pt denies shortness of breath      Other Visit Diagnoses     Rash       Relevant Medications   hydrocortisone 2.5 % ointment       Return in about 2 weeks (around 09/02/2022).      Meds ordered this encounter  Medications   hydrochlorothiazide (HYDRODIURIL) 12.5 MG tablet    Sig: Take 1 tablet (12.5 mg total) by mouth daily.    Dispense:  30 tablet    Refill:  3   hydrocortisone 2.5 % ointment    Sig: Apply to affected area BID.    Dispense:  30 g    Refill:  0    Orders Placed This Encounter  Procedures   Tdap vaccine greater than or equal to 7yo IM   COMPLETE METABOLIC PANEL WITH GFR   CBC   Microalbumin / creatinine urine ratio   HgB A1c   Pt has skin tag we will remove at follow up for HTN in 2 weeks  Charlton Amor, DO  [facility] at [facility] [phone] (phone) [phone] (fax)  [facility]

## 2022-08-20 LAB — COMPLETE METABOLIC PANEL WITH GFR
AG Ratio: 1.4 (calc) (ref 1.0–2.5)
ALT: 19 U/L (ref 9–46)
AST: 16 U/L (ref 10–40)
Albumin: 4.5 g/dL (ref 3.6–5.1)
Alkaline phosphatase (APISO): 72 U/L (ref 36–130)
BUN: 15 mg/dL (ref 7–25)
CO2: 30 mmol/L (ref 20–32)
Calcium: 9.5 mg/dL (ref 8.6–10.3)
Chloride: 104 mmol/L (ref 98–110)
Creat: 0.96 mg/dL (ref 0.60–1.26)
Globulin: 3.2 g/dL (calc) (ref 1.9–3.7)
Glucose, Bld: 101 mg/dL — ABNORMAL HIGH (ref 65–99)
Potassium: 3.9 mmol/L (ref 3.5–5.3)
Sodium: 141 mmol/L (ref 135–146)
Total Bilirubin: 0.6 mg/dL (ref 0.2–1.2)
Total Protein: 7.7 g/dL (ref 6.1–8.1)
eGFR: 105 mL/min/{1.73_m2} (ref >=60)

## 2022-08-20 LAB — HEMOGLOBIN A1C
Hgb A1c MFr Bld: 5.7 % of total Hgb — ABNORMAL HIGH (ref <5.7)
Mean Plasma Glucose: 117 mg/dL
eAG (mmol/L): 6.5 mmol/L

## 2022-08-20 LAB — MICROALBUMIN / CREATININE URINE RATIO
Creatinine, Urine: 87 mg/dL (ref 20–320)
Microalb Creat Ratio: 15 mg/g creat (ref <30)
Microalb, Ur: 1.3 mg/dL

## 2022-08-20 LAB — CBC
Hemoglobin: 14.2 g/dL (ref 13.2–17.1)
MCH: 27.7 pg (ref 27.0–33.0)
MCHC: 33.2 g/dL (ref 32.0–36.0)
MCV: 83.6 fL (ref 80.0–100.0)
MPV: 11.7 fL (ref 7.5–12.5)
Platelets: 272 10*3/uL (ref 140–400)
RBC: 5.12 10*6/uL (ref 4.20–5.80)
RDW: 13 % (ref 11.0–15.0)
WBC: 8.2 10*3/uL (ref 3.8–10.8)

## 2022-09-02 ENCOUNTER — Encounter: Payer: Self-pay | Admitting: Family Medicine

## 2022-09-02 ENCOUNTER — Ambulatory Visit (INDEPENDENT_AMBULATORY_CARE_PROVIDER_SITE_OTHER): Payer: No Typology Code available for payment source | Admitting: Family Medicine

## 2022-09-02 VITALS — BP 131/84 | HR 64 | Ht 78.0 in | Wt 356.0 lb

## 2022-09-02 DIAGNOSIS — Z8042 Family history of malignant neoplasm of prostate: Secondary | ICD-10-CM

## 2022-09-02 DIAGNOSIS — R7303 Prediabetes: Secondary | ICD-10-CM

## 2022-09-02 DIAGNOSIS — I10 Essential (primary) hypertension: Secondary | ICD-10-CM | POA: Diagnosis not present

## 2022-09-02 DIAGNOSIS — Z6841 Body Mass Index (BMI) 40.0 and over, adult: Secondary | ICD-10-CM

## 2022-09-02 DIAGNOSIS — Z8249 Family history of ischemic heart disease and other diseases of the circulatory system: Secondary | ICD-10-CM

## 2022-09-02 MED ORDER — LISINOPRIL-HYDROCHLOROTHIAZIDE 20-12.5 MG PO TABS: 1.0000 | ORAL_TABLET | Freq: Every day | ORAL | 1 refills | Status: DC

## 2022-09-02 NOTE — Assessment & Plan Note (Signed)
-   pt bp well controlled today at 131/84 - will continue lisinopril and hctz

## 2022-09-02 NOTE — Progress Notes (Signed)
Established patient visit   Patient: Todd Davis   DOB: June 08, 1985   37 y.o. Male  MRN: [id number] Visit Date: 09/02/2022  Today's healthcare provider: Charlton Amor, DO   Chief Complaint  Patient presents with   Follow-up    SUBJECTIVE    Chief Complaint  Patient presents with   Follow-up   HPI   HTN - on lisinopril 20mg  and hctz 12.5mg   -BP 131/84 - no side effects - history of HTN in Dad  Obesity - weight loss options   Prediabetes  - A1C 5.7  - has lost 12lbs - Mgrandma and mom has diabetes   Mgrandfather, aunt have heart disease  Strong family history of heart disease   Mgrandfather: prostate   Uncle: schizophrenia Half sister: bipolar, anxiety  Mom: anxiety  Half brother: anxiety and aspergers    Review of Systems  Constitutional:  Negative for activity change, fatigue and fever.  Respiratory:  Negative for cough and shortness of breath.   Cardiovascular:  Negative for chest pain.  Gastrointestinal:  Negative for abdominal pain.  Genitourinary:  Negative for difficulty urinating.       Current Meds  Medication Sig   hydrocortisone 2.5 % ointment Apply to affected area BID.   lisinopril-hydrochlorothiazide (ZESTORETIC) 20-12.5 MG tablet Take 1 tablet by mouth daily.   [DISCONTINUED] hydrochlorothiazide (HYDRODIURIL) 12.5 MG tablet Take 1 tablet (12.5 mg total) by mouth daily.   [DISCONTINUED] lisinopril (ZESTRIL) 20 MG tablet Take 1 tablet (20 mg total) by mouth daily. Last refill- Please make appt with MD before further refills    OBJECTIVE    BP 131/84 (BP Location: Right Arm, Patient Position: Sitting)   Pulse 64   Ht 6\' 6"  (1.981 m)   Wt (!) 356 lb (161.5 kg)   SpO2 99%   BMI 41.14 kg/m   Physical Exam Vitals and nursing note reviewed.  Constitutional:      General: He is not in acute distress.    Appearance: Normal appearance.  HENT:     Head: Normocephalic and atraumatic.     Right Ear: External ear normal.      Left Ear: External ear normal.     Nose: Nose normal.  Eyes:     Conjunctiva/sclera: Conjunctivae normal.  Cardiovascular:     Rate and Rhythm: Normal rate and regular rhythm.  Pulmonary:     Effort: Pulmonary effort is normal.     Breath sounds: Normal breath sounds.  Neurological:     General: No focal deficit present.     Mental Status: He is alert and oriented to person, place, and time.  Psychiatric:        Mood and Affect: Mood normal.        Behavior: Behavior normal.        Thought Content: Thought content normal.        Judgment: Judgment normal.        ASSESSMENT & PLAN    Problem List Items Addressed This Visit       Cardiovascular and Mediastinum   HTN (hypertension) - Primary    - pt bp well controlled today at 131/84 - will continue lisinopril and hctz       Relevant Medications   lisinopril-hydrochlorothiazide (ZESTORETIC) 20-12.5 MG tablet     Other   Class 3 severe obesity due to excess calories without serious comorbidity with body mass index (BMI) of 40.0 to 44.9 in adult [facility])    - pt doing  well he has lost 12lbs and is continuing diet and exercise - congratulated him on weight loss      Prediabetes    - will continue diet and exercise - repeat levels in 3 months      Other Visit Diagnoses     Family history of prostate cancer       Relevant Orders   PSA, total and free   Family history of heart disease       Relevant Orders   Lipid panel       Return in about 3 months (around 12/03/2022).      Meds ordered this encounter  Medications   lisinopril-hydrochlorothiazide (ZESTORETIC) 20-12.5 MG tablet    Sig: Take 1 tablet by mouth daily.    Dispense:  90 tablet    Refill:  1    Orders Placed This Encounter  Procedures   Lipid panel    Order Specific Question:   Has the patient fasted?    Answer:   No    Order Specific Question:   Release to patient    Answer:   Immediate   PSA, total and free     Charlton Amor, DO  [facility]  Health Primary Care & Sports Medicine at [facility] [phone] (phone) [phone] (fax)  [facility]

## 2022-09-02 NOTE — Assessment & Plan Note (Signed)
-   will continue diet and exercise - repeat levels in 3 months

## 2022-09-02 NOTE — Assessment & Plan Note (Signed)
-   pt doing well he has lost 12lbs and is continuing diet and exercise - congratulated him on weight loss

## 2022-09-03 LAB — LIPID PANEL
Cholesterol: 144 mg/dL (ref <200)
HDL: 48 mg/dL (ref >=40)
LDL Cholesterol (Calc): 82 mg/dL (calc)
Non-HDL Cholesterol (Calc): 96 mg/dL (calc) (ref <130)
Total CHOL/HDL Ratio: 3 (calc) (ref <5.0)
Triglycerides: 63 mg/dL (ref <150)

## 2022-09-03 LAB — PSA, TOTAL AND FREE
PSA, % Free: 33 % (calc) (ref >25)
PSA, Free: 0.1 ng/mL
PSA, Total: 0.3 ng/mL (ref <=4.0)

## 2022-11-30 ENCOUNTER — Ambulatory Visit (INDEPENDENT_AMBULATORY_CARE_PROVIDER_SITE_OTHER): Payer: No Typology Code available for payment source | Admitting: Family Medicine

## 2022-11-30 ENCOUNTER — Encounter: Payer: Self-pay | Admitting: Family Medicine

## 2022-11-30 VITALS — BP 122/84 | HR 65 | Ht 78.0 in | Wt 349.2 lb

## 2022-11-30 DIAGNOSIS — R7303 Prediabetes: Secondary | ICD-10-CM

## 2022-11-30 DIAGNOSIS — L237 Allergic contact dermatitis due to plants, except food: Secondary | ICD-10-CM | POA: Insufficient documentation

## 2022-11-30 DIAGNOSIS — I10 Essential (primary) hypertension: Secondary | ICD-10-CM

## 2022-11-30 MED ORDER — PREDNISONE 10 MG PO TABS: ORAL_TABLET | ORAL | 0 refills | Status: AC

## 2022-11-30 MED ORDER — CLOBETASOL PROPIONATE 0.05 % EX CREA: 1.0000 | TOPICAL_CREAM | Freq: Two times a day (BID) | CUTANEOUS | 0 refills | Status: DC

## 2022-11-30 MED ORDER — HYDROXYZINE HCL 10 MG PO TABS: 10.0000 mg | ORAL_TABLET | Freq: Three times a day (TID) | ORAL | 0 refills | Status: DC | PRN

## 2022-11-30 NOTE — Assessment & Plan Note (Signed)
Patient presents with poison ivy dermatitis of the left forearm and bilateral lower extremity.  I have given him 15-day course of prednisone with taper and counseled on taking full course.  Have also provided patient with clobetasol cream.  For itching have also done hydroxyzine. -Did educate patient that Becomes in contact with poison ivy to try and shower within an hour to help decrease poison ivy dermatitis.

## 2022-11-30 NOTE — Assessment & Plan Note (Signed)
Blood pressure well-controlled today we will continue current therapy of lisinopril-HCTZ 20-12.5.

## 2022-11-30 NOTE — Progress Notes (Signed)
Established patient visit   Patient: Todd Davis   DOB: 21-Jun-1985   37 y.o. Male  MRN: [id number] Visit Date: 11/30/2022  Today's healthcare provider: Charlton Amor, DO   Chief Complaint  Patient presents with   Poison Ivy    Pt states he was working in the yard 2 wks ago and thinks he may have poison ivy    SUBJECTIVE    Chief Complaint  Patient presents with   Poison Ivy    Pt states he was working in the yard 2 wks ago and thinks he may have poison ivy   HPI HPI     Poison [place]    Additional comments: Pt states he was working in the yard 2 wks ago and thinks he may have poison ivy      Last edited by Roselyn Reef, CMA on 11/30/2022  8:20 AM.      HTN - well controlled with lisinopril-hydrochlorothiazide 20-12.5mg   - denies chest pain, shortness of breath  Prediabetes - continues to lose weight  Posion ivy  - started two weeks ago  - has been treating with OTC hydrocortisone cream and calamine lotion, spread to bilateral lower extremity from left forearm  Review of Systems  Constitutional:  Negative for activity change, fatigue and fever.  Respiratory:  Negative for cough and shortness of breath.   Cardiovascular:  Negative for chest pain.  Gastrointestinal:  Negative for abdominal pain.  Genitourinary:  Negative for difficulty urinating.  Skin:  Positive for rash.       Current Meds  Medication Sig   clobetasol cream (TEMOVATE) 0.05 % Apply 1 Application topically 2 (two) times daily.   hydrOXYzine (ATARAX) 10 MG tablet Take 1 tablet (10 mg total) by mouth 3 (three) times daily as needed for itching.   lisinopril-hydrochlorothiazide (ZESTORETIC) 20-12.5 MG tablet Take 1 tablet by mouth daily.   predniSONE (DELTASONE) 10 MG tablet Take 4 tablets (40 mg total) by mouth daily with breakfast for 5 days, THEN 2 tablets (20 mg total) daily with breakfast for 5 days, THEN 1 tablet (10 mg total) daily with breakfast for 5 days.    OBJECTIVE    BP  122/84 (BP Location: Right Arm, Patient Position: Sitting, Cuff Size: Large)   Pulse 65   Ht 6\' 6"  (1.981 m)   Wt (!) 349 lb 4 oz (158.4 kg)   SpO2 97%   BMI 40.36 kg/m   Physical Exam Vitals and nursing note reviewed.  Constitutional:      General: He is not in acute distress.    Appearance: Normal appearance.  HENT:     Head: Normocephalic and atraumatic.     Right Ear: External ear normal.     Left Ear: External ear normal.     Nose: Nose normal.  Eyes:     Conjunctiva/sclera: Conjunctivae normal.  Cardiovascular:     Rate and Rhythm: Normal rate and regular rhythm.  Pulmonary:     Effort: Pulmonary effort is normal.     Breath sounds: Normal breath sounds.  Skin:    Findings: Rash present.     Comments: Red, fluid filled blisters on left forearm Bilateral lower extremities have redness and some crusted over lesions, some blisters noted as well  Neurological:     General: No focal deficit present.     Mental Status: He is alert and oriented to person, place, and time.  Psychiatric:        Mood and Affect:  Mood normal.        Behavior: Behavior normal.        Thought Content: Thought content normal.        Judgment: Judgment normal.        ASSESSMENT & PLAN    Problem List Items Addressed This Visit       Cardiovascular and Mediastinum   HTN (hypertension) - Primary    Blood pressure well-controlled today we will continue current therapy of lisinopril-HCTZ 20-12.5.      Relevant Orders   Basic Metabolic Panel (BMET)     Musculoskeletal and Integument   Poison ivy dermatitis    Patient presents with poison ivy dermatitis of the left forearm and bilateral lower extremity.  I have given him 15-day course of prednisone with taper and counseled on taking full course.  Have also provided patient with clobetasol cream.  For itching have also done hydroxyzine. -Did educate patient that Becomes in contact with poison ivy to try and shower within an hour to help  decrease poison ivy dermatitis.      Relevant Medications   clobetasol cream (TEMOVATE) 0.05 %   hydrOXYzine (ATARAX) 10 MG tablet     Other   Prediabetes    Will get A1c today.  Patient doing well, will continue with diet and exercise pending A1c.      Relevant Orders   HgB A1c    Return in about 6 months (around 05/30/2023).      Meds ordered this encounter  Medications   clobetasol cream (TEMOVATE) 0.05 %    Sig: Apply 1 Application topically 2 (two) times daily.    Dispense:  30 g    Refill:  0   hydrOXYzine (ATARAX) 10 MG tablet    Sig: Take 1 tablet (10 mg total) by mouth 3 (three) times daily as needed for itching.    Dispense:  30 tablet    Refill:  0   predniSONE (DELTASONE) 10 MG tablet    Sig: Take 4 tablets (40 mg total) by mouth daily with breakfast for 5 days, THEN 2 tablets (20 mg total) daily with breakfast for 5 days, THEN 1 tablet (10 mg total) daily with breakfast for 5 days.    Dispense:  35 tablet    Refill:  0    Orders Placed This Encounter  Procedures   HgB A1c   Basic Metabolic Panel (BMET)     Charlton Amor, DO  [facility] Primary Care & Sports Medicine at [facility] [phone] (phone) [phone] (fax)  [facility]

## 2022-11-30 NOTE — Assessment & Plan Note (Signed)
Will get A1c today.  Patient doing well, will continue with diet and exercise pending A1c.

## 2022-12-01 LAB — HEMOGLOBIN A1C
Est. average glucose Bld gHb Est-mCnc: 117 mg/dL
Hgb A1c MFr Bld: 5.7 % — ABNORMAL HIGH (ref 4.8–5.6)

## 2022-12-01 LAB — BASIC METABOLIC PANEL
BUN/Creatinine Ratio: 21 — ABNORMAL HIGH (ref 9–20)
BUN: 20 mg/dL (ref 6–20)
CO2: 25 mmol/L (ref 20–29)
Calcium: 9.3 mg/dL (ref 8.7–10.2)
Chloride: 103 mmol/L (ref 96–106)
Creatinine, Ser: 0.97 mg/dL (ref 0.76–1.27)
Glucose: 100 mg/dL — ABNORMAL HIGH (ref 70–99)
Potassium: 4.1 mmol/L (ref 3.5–5.2)
Sodium: 140 mmol/L (ref 134–144)
eGFR: 103 mL/min/{1.73_m2} (ref >59)

## 2022-12-06 ENCOUNTER — Ambulatory Visit: Payer: No Typology Code available for payment source | Admitting: Family Medicine

## 2023-02-09 ENCOUNTER — Encounter: Payer: Self-pay | Admitting: Family Medicine

## 2023-02-09 ENCOUNTER — Ambulatory Visit: Payer: No Typology Code available for payment source

## 2023-02-09 ENCOUNTER — Ambulatory Visit: Payer: No Typology Code available for payment source | Admitting: Family Medicine

## 2023-02-09 VITALS — BP 131/85 | HR 66 | Ht 78.0 in | Wt 348.5 lb

## 2023-02-09 DIAGNOSIS — M2011 Hallux valgus (acquired), right foot: Secondary | ICD-10-CM

## 2023-02-09 DIAGNOSIS — M79671 Pain in right foot: Secondary | ICD-10-CM

## 2023-02-09 DIAGNOSIS — R252 Cramp and spasm: Secondary | ICD-10-CM | POA: Insufficient documentation

## 2023-02-09 DIAGNOSIS — Z23 Encounter for immunization: Secondary | ICD-10-CM | POA: Diagnosis not present

## 2023-02-09 MED ORDER — MELOXICAM 15 MG PO TABS: 15.0000 mg | ORAL_TABLET | Freq: Every day | ORAL | 0 refills | Status: DC | PRN

## 2023-02-09 NOTE — Assessment & Plan Note (Addendum)
Pt presents with pain of lateral right foot. Some pain over cuboid so we will go ahead and get xrays. Will rule out stress fracture with xray. Recommend we do meloxicam and foot exercises and then if pt has no relief can send to foot specialist. Other differentials include bursitis, tendonitis

## 2023-02-09 NOTE — Progress Notes (Signed)
Established patient visit   Patient: Todd Davis   DOB: 10/25/1985   37 y.o. Male  MRN: [id number] Visit Date: 02/09/2023  Today's healthcare provider: Charlton Amor, DO   Chief Complaint  Patient presents with   Foot Pain    Right side x 1day    SUBJECTIVE    Chief Complaint  Patient presents with   Foot Pain    Right side x 1day   HPI HPI     Foot Pain    Additional comments: Right side x 1day      Last edited by Roselyn Reef, CMA on 02/09/2023 11:02 AM.       Pt presents for concerns of R foot pain. He says it started early this am and was causing him a significant amount of pain. Localizes pain to lateral aspect of R foot. Denies dehydration, increased activity yesterday.   Review of Systems  Constitutional:  Negative for activity change, fatigue and fever.  Respiratory:  Negative for cough and shortness of breath.   Cardiovascular:  Negative for chest pain.  Gastrointestinal:  Negative for abdominal pain.  Genitourinary:  Negative for difficulty urinating.  Musculoskeletal:        Right foot pain       Current Meds  Medication Sig   clobetasol cream (TEMOVATE) 0.05 % Apply 1 Application topically 2 (two) times daily.   hydrOXYzine (ATARAX) 10 MG tablet Take 1 tablet (10 mg total) by mouth 3 (three) times daily as needed for itching.   lisinopril-hydrochlorothiazide (ZESTORETIC) 20-12.5 MG tablet Take 1 tablet by mouth daily.   meloxicam (MOBIC) 15 MG tablet Take 1 tablet (15 mg total) by mouth daily as needed for pain.    OBJECTIVE    BP 131/85 (BP Location: Left Arm, Patient Position: Sitting, Cuff Size: Large)   Pulse 66   Ht 6\' 6"  (1.981 m)   Wt (!) 348 lb 8 oz (158.1 kg)   SpO2 99%   BMI 40.27 kg/m   Physical Exam Vitals and nursing note reviewed.  Constitutional:      General: He is not in acute distress.    Appearance: Normal appearance.  HENT:     Head: Normocephalic and atraumatic.     Right Ear: External ear normal.      Left Ear: External ear normal.     Nose: Nose normal.  Eyes:     Conjunctiva/sclera: Conjunctivae normal.  Cardiovascular:     Rate and Rhythm: Normal rate.  Pulmonary:     Effort: Pulmonary effort is normal.  Musculoskeletal:     Comments: Tenderness to palpation of lateral aspect of R foot over cuboid  Neurological:     General: No focal deficit present.     Mental Status: He is alert and oriented to person, place, and time.  Psychiatric:        Mood and Affect: Mood normal.        Behavior: Behavior normal.        Thought Content: Thought content normal.        Judgment: Judgment normal.        ASSESSMENT & PLAN    Problem List Items Addressed This Visit       Other   Right foot pain - Primary    Pt presents with pain of lateral right foot. Some pain over cuboid so we will go ahead and get xrays. Will rule out stress fracture with xray. Recommend we do meloxicam and foot  exercises and then if pt has no relief can send to foot specialist. Other differentials include bursitis, tendonitis       Relevant Orders   DG Foot Complete Right   Other Visit Diagnoses     Encounter for immunization       Relevant Orders   Flu vaccine trivalent PF, 6mos and older(Flulaval,Afluria,Fluarix,Fluzone) (Completed)      No follow-ups on file.      Meds ordered this encounter  Medications   meloxicam (MOBIC) 15 MG tablet    Sig: Take 1 tablet (15 mg total) by mouth daily as needed for pain.    Dispense:  30 tablet    Refill:  0    Orders Placed This Encounter  Procedures   DG Foot Complete Right    Standing Status:   Future    Number of Occurrences:   1    Standing Expiration Date:   02/09/2024    Order Specific Question:   Reason for Exam (SYMPTOM  OR DIAGNOSIS REQUIRED)    Answer:   R foot pain    Order Specific Question:   Preferred imaging location?    Answer:   [facility]   Flu vaccine trivalent PF, 6mos and older(Flulaval,Afluria,Fluarix,Fluzone)      Charlton Amor, DO  [facility] Primary Care & Sports Medicine at [facility] [phone] (phone) [phone] (fax)  [facility]

## 2023-03-04 ENCOUNTER — Other Ambulatory Visit: Payer: Self-pay | Admitting: Family Medicine

## 2023-08-04 ENCOUNTER — Encounter: Payer: Self-pay | Admitting: Family Medicine

## 2023-08-31 ENCOUNTER — Encounter: Admitting: Family Medicine

## 2023-09-21 ENCOUNTER — Encounter: Payer: Self-pay | Admitting: Family Medicine

## 2023-09-21 ENCOUNTER — Ambulatory Visit (INDEPENDENT_AMBULATORY_CARE_PROVIDER_SITE_OTHER): Admitting: Family Medicine

## 2023-09-21 VITALS — BP 115/75 | HR 66 | Temp 97.8°F | Resp 18 | Ht 78.0 in | Wt 346.5 lb

## 2023-09-21 DIAGNOSIS — Z0000 Encounter for general adult medical examination without abnormal findings: Secondary | ICD-10-CM | POA: Diagnosis not present

## 2023-09-21 DIAGNOSIS — I10 Essential (primary) hypertension: Secondary | ICD-10-CM | POA: Diagnosis not present

## 2023-09-21 DIAGNOSIS — R7302 Impaired glucose tolerance (oral): Secondary | ICD-10-CM

## 2023-09-21 DIAGNOSIS — Z136 Encounter for screening for cardiovascular disorders: Secondary | ICD-10-CM

## 2023-09-21 DIAGNOSIS — Z13220 Encounter for screening for lipoid disorders: Secondary | ICD-10-CM | POA: Diagnosis not present

## 2023-09-21 MED ORDER — LISINOPRIL-HYDROCHLOROTHIAZIDE 20-12.5 MG PO TABS: 1.0000 | ORAL_TABLET | Freq: Every day | ORAL | 1 refills | Status: DC

## 2023-09-21 NOTE — Progress Notes (Signed)
 Complete physical exam  Patient: Todd Davis   DOB: Apr 13, 1985   37 y.o. Male  MRN: [id number]  Subjective:    Chief Complaint  Patient presents with   Annual Exam    Patient is here for his annual physical, patient is fasting     Todd Davis is a 38 y.o. male who presents today for a complete physical exam. He reports consuming a low carbohydrates diet. The patient does not participate in regular exercise at present. He generally feels fairly well. He reports sleeping well. He does not have additional problems to discuss today.    Most recent fall risk assessment:    11/30/2022    8:21 AM  Fall Risk   Falls in the past year? 0  Number falls in past yr: 0  Injury with Fall? 0  Risk for fall due to : No Fall Risks  Follow up Falls evaluation completed     Most recent depression screenings:    09/21/2023    9:39 AM 11/30/2022    8:21 AM  PHQ 2/9 Scores  PHQ - 2 Score 0 0  PHQ- 9 Score 2 1    Vision:Within last year  Patient Active Problem List   Diagnosis Date Noted   Cramping of feet 02/09/2023   Right foot pain 02/09/2023   Poison ivy dermatitis 11/30/2022   Prediabetes 09/02/2022   Leg edema 08/19/2022   Low back pain without sciatica 05/25/2018   Class 3 severe obesity due to excess calories without serious comorbidity with body mass index (BMI) of 40.0 to 44.9 in adult 04/19/2018   HTN (hypertension) 10/26/2016   Demyelinating changes in brain (HCC) 10/26/2016   Obesity (BMI 35.0-39.9 without comorbidity) 07/18/2012   Past Medical History:  Diagnosis Date   Bilateral hand numbness 10/26/2016   Cerumen impaction 10/26/2016   Chest pain 12/01/2017   Costochondritis 10/26/2016   Demyelinating changes in brain (HCC) 10/26/2016   Dysuria 12/01/2017   GERD (gastroesophageal reflux disease)    Hypertension    prehypertension   Leg mass, left 10/26/2016   Light headedness 07/26/2017   Renal disorder    kidney stones   Social History   Tobacco Use    Smoking status: Never   Smokeless tobacco: Never  Vaping Use   Vaping status: Never Used  Substance Use Topics   Alcohol use: No   Drug use: No   Family Status  Relation Name Status   Mother  Alive   Father  Alive   MGM  Deceased   MGF  Deceased   PGM  Alive   PGF  Deceased   Sister  Alive   Brother  Alive   Brother  Alive   Nutritional therapist  (Not Specified)  No partnership data on file   No Known Allergies    Patient Care Team: Bevin Bernice RAMAN, DO (Inactive) as PCP - General (Family Medicine) Jeffrie Oneil BROCKS, MD as PCP - Cardiology (Cardiology)   Outpatient Medications Prior to Visit  Medication Sig   Fexofenadine HCl (ALLERGY 24-HR PO) Take by mouth.   lisinopril -hydrochlorothiazide  (ZESTORETIC ) 20-12.5 MG tablet TAKE 1 TABLET BY MOUTH EVERY DAY   [DISCONTINUED] clobetasol  cream (TEMOVATE ) 0.05 % Apply 1 Application topically 2 (two) times daily.   [DISCONTINUED] hydrocortisone  2.5 % ointment Apply to affected area BID. (Patient not taking: Reported on 02/09/2023)   [DISCONTINUED] hydrOXYzine  (ATARAX ) 10 MG tablet Take 1 tablet (10 mg total) by mouth 3 (three) times daily as needed for itching. (  Patient not taking: Reported on 09/21/2023)   [DISCONTINUED] meloxicam  (MOBIC ) 15 MG tablet Take 1 tablet (15 mg total) by mouth daily as needed for pain.   No facility-administered medications prior to visit.    Review of Systems  All other systems reviewed and are negative.        Objective:     BP 115/75   Pulse 66   Temp 97.8 F (36.6 C) (Oral)   Resp 18   Ht 6' 6 (1.981 m)   Wt (!) 346 lb 8 oz (157.2 kg)   SpO2 97%   BMI 40.04 kg/m  BP Readings from Last 3 Encounters:  09/21/23 115/75  02/09/23 131/85  11/30/22 122/84      Physical Exam Vitals and nursing note reviewed.  Constitutional:      Appearance: Normal appearance. He is normal weight.  HENT:     Head: Normocephalic and atraumatic.     Right Ear: Tympanic membrane, ear canal and external ear  normal.     Left Ear: Tympanic membrane, ear canal and external ear normal.     Nose: Nose normal.     Mouth/Throat:     Mouth: Mucous membranes are moist.     Pharynx: Oropharynx is clear.   Eyes:     Conjunctiva/sclera: Conjunctivae normal.     Pupils: Pupils are equal, round, and reactive to light.    Cardiovascular:     Rate and Rhythm: Normal rate and regular rhythm.     Pulses: Normal pulses.     Heart sounds: Normal heart sounds.  Pulmonary:     Effort: Pulmonary effort is normal.     Breath sounds: Normal breath sounds.  Abdominal:     General: Abdomen is flat. Bowel sounds are normal.   Skin:    General: Skin is warm.     Capillary Refill: Capillary refill takes less than 2 seconds.   Neurological:     General: No focal deficit present.     Mental Status: He is alert and oriented to person, place, and time. Mental status is at baseline.   Psychiatric:        Mood and Affect: Mood normal.        Behavior: Behavior normal.        Thought Content: Thought content normal.        Judgment: Judgment normal.     No results found for any visits on 09/21/23.     Assessment & Plan:    Routine Health Maintenance and Physical Exam  Immunization History  Administered Date(s) Administered   Influenza Inj Mdck Quad Pf 01/03/2019   Influenza, Seasonal, Injecte, Preservative Fre 02/09/2023   Influenza,inj,Quad PF,6+ Mos 11/30/2017, 05/15/2021   Influenza,inj,quad, With Preservative 01/03/2019   Influenza-Unspecified 12/24/2015, 01/03/2019, 01/24/2020   PFIZER(Purple Top)SARS-COV-2 Vaccination 06/04/2019, 06/25/2019   Tdap 08/15/2012, 08/19/2022    Health Maintenance  Topic Date Due   Hepatitis B Vaccines (1 of 3 - 19+ 3-dose series) Never done   HPV VACCINES (1 - 3-dose SCDM series) Never done   COVID-19 Vaccine (3 - 2024-25 season) 11/28/2022   INFLUENZA VACCINE  10/28/2023   DTaP/Tdap/Td (3 - Td or Tdap) 08/18/2032   Hepatitis C Screening  Completed   HIV  Screening  Completed   Meningococcal B Vaccine  Aged Out    Discussed health benefits of physical activity, and encouraged him to engage in regular exercise appropriate for his age and condition.  Problem List Items Addressed This Visit  None  No follow-ups on file. Annual physical exam  Encounter for lipid screening for cardiovascular disease -     Lipid panel  Impaired glucose tolerance -     CBC with Differential/Platelet -     Comprehensive metabolic panel with GFR -     Hemoglobin A1c  Primary hypertension -     Lisinopril -hydroCHLOROthiazide ; Take 1 tablet by mouth daily.  Dispense: 90 tablet; Refill: 1   Screening labs Refilled BP medicine today See in 6 months sooner prn.    Torrence CINDERELLA Barrier, MD

## 2023-09-24 LAB — CBC WITH DIFFERENTIAL/PLATELET
Basophils Absolute: 0.1 10*3/uL (ref 0.0–0.2)
Basos: 1 %
EOS (ABSOLUTE): 0.3 10*3/uL (ref 0.0–0.4)
Eos: 4 %
Hematocrit: 44.9 % (ref 37.5–51.0)
Hemoglobin: 14.1 g/dL (ref 13.0–17.7)
Immature Grans (Abs): 0 10*3/uL (ref 0.0–0.1)
Immature Granulocytes: 0 %
Lymphocytes Absolute: 1.9 10*3/uL (ref 0.7–3.1)
Lymphs: 25 %
MCH: 27.6 pg (ref 26.6–33.0)
MCHC: 31.4 g/dL — ABNORMAL LOW (ref 31.5–35.7)
MCV: 88 fL (ref 79–97)
Monocytes Absolute: 0.6 10*3/uL (ref 0.1–0.9)
Monocytes: 7 %
Neutrophils Absolute: 4.8 10*3/uL (ref 1.4–7.0)
Neutrophils: 63 %
Platelets: 246 10*3/uL (ref 150–450)
RBC: 5.1 x10E6/uL (ref 4.14–5.80)
RDW: 13 % (ref 11.6–15.4)
WBC: 7.7 10*3/uL (ref 3.4–10.8)

## 2023-09-24 LAB — HEMOGLOBIN A1C
Est. average glucose Bld gHb Est-mCnc: 111 mg/dL
Hgb A1c MFr Bld: 5.5 % (ref 4.8–5.6)

## 2023-09-24 LAB — COMPREHENSIVE METABOLIC PANEL WITH GFR
ALT: 40 IU/L (ref 0–44)
AST: 33 IU/L (ref 0–40)
Albumin: 4.6 g/dL (ref 4.1–5.1)
Alkaline Phosphatase: 73 IU/L (ref 44–121)
BUN/Creatinine Ratio: 17 (ref 9–20)
BUN: 17 mg/dL (ref 6–20)
Bilirubin Total: 0.6 mg/dL (ref 0.0–1.2)
CO2: 24 mmol/L (ref 20–29)
Calcium: 9.8 mg/dL (ref 8.7–10.2)
Chloride: 102 mmol/L (ref 96–106)
Creatinine, Ser: 1.03 mg/dL (ref 0.76–1.27)
Globulin, Total: 2.9 g/dL (ref 1.5–4.5)
Glucose: 94 mg/dL (ref 70–99)
Potassium: 4.1 mmol/L (ref 3.5–5.2)
Sodium: 141 mmol/L (ref 134–144)
Total Protein: 7.5 g/dL (ref 6.0–8.5)
eGFR: 96 mL/min/{1.73_m2} (ref >59)

## 2023-09-24 LAB — LIPID PANEL
Chol/HDL Ratio: 3.5 ratio (ref 0.0–5.0)
Cholesterol, Total: 145 mg/dL (ref 100–199)
HDL: 41 mg/dL (ref >39)
LDL Chol Calc (NIH): 91 mg/dL (ref 0–99)
Triglycerides: 62 mg/dL (ref 0–149)
VLDL Cholesterol Cal: 13 mg/dL (ref 5–40)

## 2023-09-26 ENCOUNTER — Ambulatory Visit: Payer: Self-pay | Admitting: Family Medicine

## 2024-03-23 ENCOUNTER — Ambulatory Visit: Admitting: Family Medicine

## 2024-03-23 ENCOUNTER — Other Ambulatory Visit: Payer: Self-pay | Admitting: Family Medicine

## 2024-03-23 DIAGNOSIS — I10 Essential (primary) hypertension: Secondary | ICD-10-CM
